# Patient Record
Sex: Male | Born: 1965 | Race: White | Hispanic: No | Marital: Single | Smoking: Former smoker
Health system: Southern US, Community
[De-identification: ages and names within clinical notes are randomized; demographics above are authoritative.]

## PROBLEM LIST (undated history)

## (undated) DIAGNOSIS — I517 Cardiomegaly: Secondary | ICD-10-CM

## (undated) DIAGNOSIS — I513 Intracardiac thrombosis, not elsewhere classified: Secondary | ICD-10-CM

## (undated) DIAGNOSIS — I2510 Atherosclerotic heart disease of native coronary artery without angina pectoris: Secondary | ICD-10-CM

## (undated) DIAGNOSIS — I471 Supraventricular tachycardia: Secondary | ICD-10-CM

## (undated) DIAGNOSIS — I509 Heart failure, unspecified: Secondary | ICD-10-CM

## (undated) HISTORY — DX: Heart failure, unspecified: I50.9

## (undated) HISTORY — DX: Atherosclerotic heart disease of native coronary artery without angina pectoris: I25.10

## (undated) HISTORY — PX: OTHER SURGICAL HISTORY: SHX169

## (undated) HISTORY — DX: Intracardiac thrombosis, not elsewhere classified: I51.3

## (undated) HISTORY — PX: TONSILLECTOMY: SUR1361

---

## 2015-08-14 DIAGNOSIS — I513 Intracardiac thrombosis, not elsewhere classified: Secondary | ICD-10-CM

## 2015-08-14 HISTORY — DX: Intracardiac thrombosis, not elsewhere classified: I51.3

## 2016-03-07 ENCOUNTER — Inpatient Hospital Stay
Admission: EM | Admit: 2016-03-07 | Discharge: 2016-03-12 | DRG: 314 | Disposition: A | Discharge status: Home Health | Payer: BLUE CROSS/BLUE SHIELD | Attending: Specialist | Admitting: Specialist

## 2016-03-07 ENCOUNTER — Encounter: Payer: Self-pay | Admitting: Emergency Medicine

## 2016-03-07 ENCOUNTER — Inpatient Hospital Stay
Admit: 2016-03-07 | Discharge: 2016-03-07 | Disposition: A | Discharge status: Home / Self Care | Payer: BLUE CROSS/BLUE SHIELD | Attending: Specialist | Admitting: Specialist

## 2016-03-07 ENCOUNTER — Emergency Department: Payer: BLUE CROSS/BLUE SHIELD

## 2016-03-07 DIAGNOSIS — S2231XA Fracture of one rib, right side, initial encounter for closed fracture: Secondary | ICD-10-CM | POA: Diagnosis present

## 2016-03-07 DIAGNOSIS — Z833 Family history of diabetes mellitus: Secondary | ICD-10-CM | POA: Diagnosis not present

## 2016-03-07 DIAGNOSIS — I5023 Acute on chronic systolic (congestive) heart failure: Secondary | ICD-10-CM | POA: Diagnosis present

## 2016-03-07 DIAGNOSIS — E039 Hypothyroidism, unspecified: Secondary | ICD-10-CM | POA: Diagnosis present

## 2016-03-07 DIAGNOSIS — F1010 Alcohol abuse, uncomplicated: Secondary | ICD-10-CM | POA: Diagnosis present

## 2016-03-07 DIAGNOSIS — Z87891 Personal history of nicotine dependence: Secondary | ICD-10-CM | POA: Diagnosis not present

## 2016-03-07 DIAGNOSIS — I426 Alcoholic cardiomyopathy: Secondary | ICD-10-CM | POA: Diagnosis not present

## 2016-03-07 DIAGNOSIS — X509XXA Other and unspecified overexertion or strenuous movements or postures, initial encounter: Secondary | ICD-10-CM

## 2016-03-07 DIAGNOSIS — Y929 Unspecified place or not applicable: Secondary | ICD-10-CM

## 2016-03-07 DIAGNOSIS — E876 Hypokalemia: Secondary | ICD-10-CM | POA: Diagnosis present

## 2016-03-07 DIAGNOSIS — I420 Dilated cardiomyopathy: Secondary | ICD-10-CM | POA: Diagnosis present

## 2016-03-07 DIAGNOSIS — I110 Hypertensive heart disease with heart failure: Secondary | ICD-10-CM | POA: Diagnosis present

## 2016-03-07 DIAGNOSIS — R109 Unspecified abdominal pain: Secondary | ICD-10-CM

## 2016-03-07 DIAGNOSIS — I513 Intracardiac thrombosis, not elsewhere classified: Secondary | ICD-10-CM | POA: Diagnosis present

## 2016-03-07 DIAGNOSIS — I214 Non-ST elevation (NSTEMI) myocardial infarction: Secondary | ICD-10-CM | POA: Diagnosis present

## 2016-03-07 DIAGNOSIS — R0781 Pleurodynia: Secondary | ICD-10-CM

## 2016-03-07 HISTORY — DX: Supraventricular tachycardia: I47.1

## 2016-03-07 HISTORY — DX: Cardiomegaly: I51.7

## 2016-03-07 LAB — CBC
HCT: 50.7 % (ref 40.0–52.0)
HEMOGLOBIN: 16.6 g/dL (ref 13.0–18.0)
MCH: 28.7 pg (ref 26.0–34.0)
MCHC: 32.7 g/dL (ref 32.0–36.0)
MCV: 87.8 fL (ref 80.0–100.0)
PLATELETS: 196 10*3/uL (ref 150–440)
RBC: 5.77 MIL/uL (ref 4.40–5.90)
RDW: 14.3 % (ref 11.5–14.5)
WBC: 12.4 10*3/uL — AB (ref 3.8–10.6)

## 2016-03-07 LAB — APTT: APTT: 28 s (ref 24–36)

## 2016-03-07 LAB — COMPREHENSIVE METABOLIC PANEL
ALBUMIN: 3.6 g/dL (ref 3.5–5.0)
ALK PHOS: 103 U/L (ref 38–126)
ALT: 30 U/L (ref 17–63)
AST: 49 U/L — AB (ref 15–41)
Anion gap: 9 (ref 5–15)
BUN: 26 mg/dL — AB (ref 6–20)
CHLORIDE: 105 mmol/L (ref 101–111)
CO2: 25 mmol/L (ref 22–32)
CREATININE: 1.22 mg/dL (ref 0.61–1.24)
Calcium: 8.8 mg/dL — ABNORMAL LOW (ref 8.9–10.3)
GFR calc non Af Amer: >60 mL/min (ref >60)
GLUCOSE: 106 mg/dL — AB (ref 65–99)
Potassium: 4.1 mmol/L (ref 3.5–5.1)
SODIUM: 139 mmol/L (ref 135–145)
Total Bilirubin: 1.5 mg/dL — ABNORMAL HIGH (ref 0.3–1.2)
Total Protein: 6.4 g/dL — ABNORMAL LOW (ref 6.5–8.1)

## 2016-03-07 LAB — PROTIME-INR
INR: 1.16
Prothrombin Time: 14.9 seconds (ref 11.4–15.2)

## 2016-03-07 LAB — TROPONIN I
TROPONIN I: 3.78 ng/mL — AB (ref <0.03)
Troponin I: 3.9 ng/mL (ref <0.03)

## 2016-03-07 MED ORDER — BENZONATATE 100 MG PO CAPS
100.0000 mg | ORAL_CAPSULE | Freq: Three times a day (TID) | ORAL | Status: DC | PRN
  Administered 2016-03-07 – 2016-03-12 (×2): 100 mg via ORAL
  Filled 2016-03-07 (×2): qty 1

## 2016-03-07 MED ORDER — HEPARIN (PORCINE) IN NACL 100-0.45 UNIT/ML-% IJ SOLN
1150.0000 [IU]/h | INTRAMUSCULAR | Status: DC
  Administered 2016-03-07: 850 [IU]/h via INTRAVENOUS
  Administered 2016-03-08 – 2016-03-10 (×3): 1250 [IU]/h via INTRAVENOUS
  Administered 2016-03-11 (×2): 1150 [IU]/h via INTRAVENOUS
  Filled 2016-03-07 (×12): qty 250

## 2016-03-07 MED ORDER — FUROSEMIDE 10 MG/ML IJ SOLN
20.0000 mg | Freq: Two times a day (BID) | INTRAMUSCULAR | Status: DC
  Administered 2016-03-07: 20 mg via INTRAVENOUS
  Filled 2016-03-07 (×2): qty 2

## 2016-03-07 MED ORDER — METOPROLOL SUCCINATE ER 25 MG PO TB24
25.0000 mg | ORAL_TABLET | Freq: Every day | ORAL | Status: DC
  Administered 2016-03-08 – 2016-03-10 (×3): 25 mg via ORAL
  Filled 2016-03-07 (×3): qty 1

## 2016-03-07 MED ORDER — OXYCODONE-ACETAMINOPHEN 5-325 MG PO TABS
2.0000 | ORAL_TABLET | Freq: Once | ORAL | Status: AC
  Administered 2016-03-07: 2 via ORAL
  Filled 2016-03-07: qty 2

## 2016-03-07 MED ORDER — LISINOPRIL 5 MG PO TABS
5.0000 mg | ORAL_TABLET | Freq: Every day | ORAL | Status: DC
  Administered 2016-03-07 – 2016-03-12 (×6): 5 mg via ORAL
  Filled 2016-03-07 (×7): qty 1

## 2016-03-07 MED ORDER — MORPHINE SULFATE (PF) 2 MG/ML IV SOLN
1.0000 mg | INTRAVENOUS | Status: DC | PRN
  Administered 2016-03-08: 1 mg via INTRAVENOUS
  Filled 2016-03-07: qty 1

## 2016-03-07 MED ORDER — ACETAMINOPHEN 650 MG RE SUPP: 650.0000 mg | Freq: Four times a day (QID) | RECTAL | Status: DC | PRN

## 2016-03-07 MED ORDER — ASPIRIN EC 81 MG PO TBEC
81.0000 mg | DELAYED_RELEASE_TABLET | Freq: Every day | ORAL | Status: DC
  Administered 2016-03-07 – 2016-03-12 (×6): 81 mg via ORAL
  Filled 2016-03-07 (×6): qty 1

## 2016-03-07 MED ORDER — ONDANSETRON HCL 4 MG PO TABS: 4.0000 mg | ORAL_TABLET | Freq: Four times a day (QID) | ORAL | Status: DC | PRN

## 2016-03-07 MED ORDER — ASPIRIN 81 MG PO CHEW
324.0000 mg | CHEWABLE_TABLET | Freq: Once | ORAL | Status: AC
  Administered 2016-03-07: 324 mg via ORAL
  Filled 2016-03-07: qty 4

## 2016-03-07 MED ORDER — ONDANSETRON HCL 4 MG/2ML IJ SOLN: 4.0000 mg | Freq: Four times a day (QID) | INTRAMUSCULAR | Status: DC | PRN

## 2016-03-07 MED ORDER — HYDRALAZINE HCL 20 MG/ML IJ SOLN: 10.0000 mg | Freq: Four times a day (QID) | INTRAMUSCULAR | Status: DC | PRN

## 2016-03-07 MED ORDER — HEPARIN BOLUS VIA INFUSION
4000.0000 [IU] | Freq: Once | INTRAVENOUS | Status: AC
  Administered 2016-03-07: 4000 [IU] via INTRAVENOUS
  Filled 2016-03-07: qty 4000

## 2016-03-07 MED ORDER — GUAIFENESIN 100 MG/5ML PO SOLN
5.0000 mL | ORAL | Status: DC | PRN
  Administered 2016-03-07 – 2016-03-12 (×3): 100 mg via ORAL
  Filled 2016-03-07 (×4): qty 10

## 2016-03-07 MED ORDER — OXYCODONE HCL 5 MG PO TABS
5.0000 mg | ORAL_TABLET | ORAL | Status: DC | PRN
  Administered 2016-03-08 – 2016-03-12 (×4): 5 mg via ORAL
  Filled 2016-03-07 (×4): qty 1

## 2016-03-07 MED ORDER — ACETAMINOPHEN 325 MG PO TABS: 650.0000 mg | ORAL_TABLET | Freq: Four times a day (QID) | ORAL | Status: DC | PRN

## 2016-03-07 MED ORDER — ATORVASTATIN CALCIUM 20 MG PO TABS
40.0000 mg | ORAL_TABLET | Freq: Every day | ORAL | Status: DC
  Administered 2016-03-08 – 2016-03-11 (×4): 40 mg via ORAL
  Filled 2016-03-07 (×4): qty 2

## 2016-03-07 NOTE — H&P (Signed)
[facility] -  at [facility]   PATIENT NAME: Theodore Brandt    MR#:  [id number]  DATE OF BIRTH:  1966-01-14  DATE OF ADMISSION:  03/07/2016  PRIMARY CARE PHYSICIAN: No primary care provider on file.   REQUESTING/REFERRING PHYSICIAN: Dr. Minna Antis  CHIEF COMPLAINT:   Chief Complaint  Patient presents with  . Leg Swelling  . Cough    HISTORY OF PRESENT ILLNESS:  Theodore Brandt  is a 50 y.o. male with a known history of LVH, paroxysmal SVT who presents to the hospital due to a chronic cough and lower extremity edema. Patient has had a cough which has been bothersome for him for about 5 weeks. He has been seen by his primary care physician and treated for acute bronchitis without much improvement of symptoms. He then developed some lower extremity edema and his primary care physician referred him to a cardiologist started him on some Lasix and also some beta blocker. He was set up to get echocardiogram coming up in the next few days but he continued to have significant cough and shortness of breath and therefore came to the ER for further evaluation today. Emergency room patient was noted to have a rib fracture on the right side and also noted to have a elevated troponin at 3.9. Hospitalist services were contacted further treatment and evaluation. Patient does complain of chest pain but is more pleuritic in nature related to the right side of his chest. He denies any chest tightness, nausea, vomiting, diaphoresis, palpitations, syncope or any other associated symptoms. Given his elevated troponin and being in mild acute on chronic CHF hospitalist services were contacted further treatment and evaluation.  PAST MEDICAL HISTORY:   Past Medical History:  Diagnosis Date  . Enlarged heart   . Paroxysmal SVT (supraventricular tachycardia) (HCC)     PAST SURGICAL HISTORY:   Past Surgical History:  Procedure Laterality Date  . arm surgery    . TONSILLECTOMY       SOCIAL HISTORY:   Social History  Substance Use Topics  . Smoking status: Former Smoker    Types: Cigars  . Smokeless tobacco: Not on file  . Alcohol use Yes     Comment: Used to drink a 6-12 pack daily.      FAMILY HISTORY:   Family History  Problem Relation Age of Onset  . Diabetes Mother   . Hemochromatosis Father     DRUG ALLERGIES:  No Known Allergies  REVIEW OF SYSTEMS:   Review of Systems  Constitutional: Negative for fever and weight loss.  HENT: Negative for congestion, nosebleeds and tinnitus.   Eyes: Negative for blurred vision, double vision and redness.  Respiratory: Positive for cough and shortness of breath. Negative for hemoptysis.   Cardiovascular: Positive for chest pain, leg swelling and PND. Negative for orthopnea.  Gastrointestinal: Negative for abdominal pain, diarrhea, melena, nausea and vomiting.  Genitourinary: Negative for dysuria, hematuria and urgency.  Musculoskeletal: Negative for falls and joint pain.  Neurological: Negative for dizziness, tingling, sensory change, focal weakness, seizures, weakness and headaches.  Endo/Heme/Allergies: Negative for polydipsia. Does not bruise/bleed easily.  Psychiatric/Behavioral: Negative for depression and memory loss. The patient is not nervous/anxious.     MEDICATIONS AT HOME:   Prior to Admission medications   Medication Sig Start Date End Date Taking? Authorizing Provider  furosemide (LASIX) 20 MG tablet Take 1 tablet by mouth daily. 03/02/16  Yes Historical Provider, MD  metoprolol succinate (TOPROL-XL) 25 MG 24 hr tablet  Take 1 tablet by mouth daily. 02/28/16  Yes Historical Provider, MD  minocycline (MINOCIN,DYNACIN) 100 MG capsule Take 100 mg by mouth 2 (two) times daily.  03/02/16  Yes Historical Provider, MD      VITAL SIGNS:  Blood pressure (!) 140/111, pulse 99, temperature 98.4 F (36.9 C), temperature source Oral, resp. rate 19, height 5' 5.5" (1.664 m), weight 69.5 kg (153 lb 5 oz),  SpO2 97 %.  PHYSICAL EXAMINATION:  Physical Exam  GENERAL:  50 y.o.-year-old patient lying in the bed in no acute distress.  EYES: Pupils equal, round, reactive to light and accommodation. No scleral icterus. Extraocular muscles intact.  HEENT: Head atraumatic, normocephalic. Oropharynx and nasopharynx clear. No oropharyngeal erythema, moist oral mucosa  NECK:  Supple, no jugular venous distention. No thyroid enlargement, no tenderness.  LUNGS: Normal breath sounds bilaterally, no wheezing, rales, rhonchi. No use of accessory muscles of respiration.  CARDIOVASCULAR: S1, S2 RRR. No murmurs, rubs, gallops, clicks.  ABDOMEN: Soft, nontender, nondistended. Bowel sounds present. No organomegaly or mass.  EXTREMITIES: + 2 pitting edema b/l,  No cyanosis, clubbing. + 2 pedal & radial pulses b/l.   NEUROLOGIC: Cranial nerves II through XII are intact. No focal Motor or sensory deficits appreciated b/l PSYCHIATRIC: The patient is alert and oriented x 3. Good affect.  SKIN: No obvious rash, lesion, or ulcer.   LABORATORY PANEL:   CBC  Recent Labs Lab 03/07/16 1641  WBC 12.4*  HGB 16.6  HCT 50.7  PLT 196   ------------------------------------------------------------------------------------------------------------------  Chemistries   Recent Labs Lab 03/07/16 1641  NA 139  K 4.1  CL 105  CO2 25  GLUCOSE 106*  BUN 26*  CREATININE 1.22  CALCIUM 8.8*  AST 49*  ALT 30  ALKPHOS 103  BILITOT 1.5*   ------------------------------------------------------------------------------------------------------------------  Cardiac Enzymes  Recent Labs Lab 03/07/16 1641  TROPONINI 3.90*   ------------------------------------------------------------------------------------------------------------------  RADIOLOGY:  Dg Chest 2 View  Result Date: 03/07/2016 CLINICAL DATA:  Worsening productive cough, shortness of breath and right rib pain over the past 6 weeks. EXAM: CHEST  2 VIEW  COMPARISON:  None. FINDINGS: There is marked cardiomegaly without edema. Very small right pleural effusion is seen. Lungs appear clear. The patient has an acute fracture of the right eighth rib. IMPRESSION: Cardiomegaly without edema. Small right pleural effusion. Acute right eighth rib fracture.  No pneumothorax. Electronically Signed   By: Drusilla Kanner M.D.   On: 03/07/2016 15:27    IMPRESSION AND PLAN:   50 year old male with past medical history of alcohol abuse, paroxysmal SVT, LVH who presents to the hospital due to shortness of breath and cough and noted to be in mild CHF and also noted to have an elevated troponin.  1. Non-ST elevation MI-patient does rule in with cardiac markers given his elevated troponin at 3.9. -He is currently chest pain-free, hemodynamically stable. -Place him on telemetry, cycle his cardiac markers. -Start him on aspirin, heparin nomogram, beta blocker, Ace, statin. I will check a two-dimensional echocardiogram. -Get a cardiology consult.  2. CHF-acute on chronic. Unclear if this is systolic versus diastolic CHF presently. -We will check a two-dimensional echo. Diuresis with IV Lasix, follow I's and O's and daily weights. -Continue metoprolol, add low-dose ACE inhibitor. -Await cardiology input.  3. Fractured right eighth rib-secondary to increasing cough -We'll place him on antitussives. When necessary morphine, oxycodone for pain control.  4. Essential hypertension-continue metoprolol, add low-dose ACE inhibitor. -If needed add when necessary hydralazine.    All the  records are reviewed and case discussed with ED provider. Management plans discussed with the patient, family and they are in agreement.  CODE STATUS: Full   TOTAL TIME TAKING CARE OF THIS PATIENT: 45 minutes.    Houston Siren M.D on 03/07/2016 at 6:36 PM  Between 7am to 6pm - Pager - [phone]  After 6pm go to [email or web address] - password [email or web address] [facility] [address]  [place]  [facility]  Office  [phone]  CC: Primary care physician; No primary care provider on file.

## 2016-03-07 NOTE — ED Triage Notes (Signed)
Pt presents to ED from [facility] for reports of lower leg edema bilaterally and cough. Pt states has been coughing for several weeks and feels like he has bruised a rib from coughing.

## 2016-03-07 NOTE — Progress Notes (Signed)
Nurse was informed by Sedonia Small from echocardiogram that patient has two thrombus in the left ventricle. Dr.Willis notified. Nurse informed Dr. Anne Hahn that patient was currently on a heparin drip at 8.5. Cardiologist consult ordered. Will continue to monitor.

## 2016-03-07 NOTE — ED Notes (Signed)
Attempted to call report, nurse not avalaible given name and number to call back.

## 2016-03-07 NOTE — Progress Notes (Signed)
ANTICOAGULATION CONSULT NOTE - Initial Consult  Pharmacy Consult for heparin drip Indication: chest pain/ACS  No Known Allergies  Patient Measurements: Height: 5' 5.5" (166.4 cm) Weight: 153 lb 5 oz (69.5 kg) IBW/kg (Calculated) : 62.65 Heparin Dosing Weight: 70 kg  Vital Signs: Temp: 98.4 F (36.9 C) (07/26 1424) Temp Source: Oral (07/26 1424) BP: 126/98 (07/26 1630) Pulse Rate: 101 (07/26 1630)  Labs:  Recent Labs  03/07/16 1641  HGB 16.6  HCT 50.7  PLT 196  CREATININE 1.22  TROPONINI 3.90*   Estimated Creatinine Clearance: 64.2 mL/min (by C-G formula based on SCr of 1.22 mg/dL).  Medical History: Past Medical History:  Diagnosis Date  . Enlarged heart   . Paroxysmal SVT (supraventricular tachycardia) (HCC)     Assessment: Pharmacy consulted to dose and monitor heparin drip in this 50 year old male admitted with ACS/NSTEMI. Patient was not taking anticoagulants prior to admission and baseline labs were obtained.   Goal of Therapy:  Heparin level 0.3-0.7 units/ml Monitor platelets by anticoagulation protocol: Yes   Plan:  Give 4000 units bolus x 1 Start heparin infusion at 850 units/hr Check anti-Xa level in 6 hours and daily while on heparin Continue to monitor H&H and platelets  Cindi Carbon, PharmD, BCPS Clinical Pharmacist 03/07/2016,6:07 PM

## 2016-03-07 NOTE — ED Provider Notes (Addendum)
[facility] Emergency Department Provider Note  Time seen: 5:12 PM  I have reviewed the triage vital signs and the nursing notes.   HISTORY  Chief Complaint Leg Swelling and Cough    HPI Theodore Brandt is a 50 y.o. male with a past medical history of SVT, who presents the emergency department for 6 weeks of cough, and several weeks of lower extremity swelling. According to the patient he has had a cough for approximately 6 weeks. He was seen by his primary care doctor recently for the same, was referred to cardiology. Patient was given a prescription for Lasix one week ago by Dr. Gwen Pounds. Patient states continued cough, now with right sided chest pain, much worse when he coughs, takes a deep breath or moves. Describes the chest pain is moderate, sharp when he moves or coughs.  Past Medical History:  Diagnosis Date  . Enlarged heart   . Paroxysmal SVT (supraventricular tachycardia) (HCC)     There are no active problems to display for this patient.   Past Surgical History:  Procedure Laterality Date  . arm surgery    . TONSILLECTOMY      Prior to Admission medications   Not on File    Allergies Review of patient's allergies indicates no known allergies.  No family history on file.  Social History Social History  Substance Use Topics  . Smoking status: Not on file  . Smokeless tobacco: Not on file  . Alcohol use Not on file    Review of Systems Constitutional: Negative for fever. Cardiovascular: Right-sided chest pain. Respiratory: Negative for shortness of breath. Positive for dry cough. Gastrointestinal: Negative for abdominal pain, vomiting and diarrhea. Musculoskeletal: Negative for back pain. Neurological: Negative for headache 10-point ROS otherwise negative.  ____________________________________________   PHYSICAL EXAM:  VITAL SIGNS: ED Triage Vitals [03/07/16 1424]  Enc Vitals Group     BP 125/90     Pulse Rate 98    Resp 18     Temp 98.4 F (36.9 C)     Temp Source Oral     SpO2 98 %     Weight 153 lb 5 oz (69.5 kg)     Height 5' 5.5" (1.664 m)     Head Circumference      Peak Flow      Pain Score      Pain Loc      Pain Edu?      Excl. in GC?     Constitutional: Alert and oriented. Well appearing and in no distress. Eyes: Normal exam ENT   Head: Normocephalic and atraumatic   Mouth/Throat: Mucous membranes are moist. Cardiovascular: Normal rate, regular rhythm. No murmur Respiratory: Normal respiratory effort without tachypnea nor retractions. Breath sounds are clear and equal bilaterally. No wheezes/rales/rhonchi. Gastrointestinal: Soft and nontender. No distention.   Musculoskeletal: Nontender with normal range of motion in all extremities.  Neurologic:  Normal speech and language. No gross focal neurologic deficits  Psychiatric: Mood and affect are normal.   ____________________________________________    EKG  EKG reviewed and interpreted by myself shows sinus tachycardiaAt 99 bpm with narrow QRS, normal axis, normal intervals, diffuse T-wave inversions in the inferolateral leads.    ____________________________________________    RADIOLOGY  Chest x-ray shows acute right eighth fracture  ____________________________________________   INITIAL IMPRESSION / ASSESSMENT AND PLAN / ED COURSE  Pertinent labs & imaging results that were available during my care of the patient were reviewed by me and considered  in my medical decision making (see chart for details).  Patient presents the emergency department 6 weeks of cough, now with right-sided chest pain, sent from his primary care doctor. Patient states lower extremity edema but states this is improving after Dr. Gwen Pounds started him on Lasix one week ago. Patient denies any sputum production, or fever. Patient was seen by his primary care doctor for the cough and was started on antibiotics, he just started taking them  yesterday. Patient's chest x-ray is consistent with a right eighth rib fracture. We will check labs including troponin, EKG, cause monitor in the emergency department. We'll dose pain medication and provided an incentive spirometer.  Patient's labs have resulted showing a troponin at 3.9, EKG shows diffuse T-wave inversions in the inferolateral leads. We will start on heparin drip, patient will be admitted to the hospital for an NSTEMI.   CRITICAL CARE Performed by: Minna Antis   Total critical care time: 30 minutes  Critical care time was exclusive of separately billable procedures and treating other patients.  Critical care was necessary to treat or prevent imminent or life-threatening deterioration.  Critical care was time spent personally by me on the following activities: development of treatment plan with patient and/or surrogate as well as nursing, discussions with consultants, evaluation of patient's response to treatment, examination of patient, obtaining history from patient or surrogate, ordering and performing treatments and interventions, ordering and review of laboratory studies, ordering and review of radiographic studies, pulse oximetry and re-evaluation of patient's condition.  ____________________________________________   FINAL CLINICAL IMPRESSION(S) / ED DIAGNOSES  Right rib fracture Bronchitis NSTEMI   Minna Antis, MD 03/07/16 1751    Minna Antis, MD 03/07/16 1610

## 2016-03-08 ENCOUNTER — Inpatient Hospital Stay: Payer: BLUE CROSS/BLUE SHIELD

## 2016-03-08 LAB — BASIC METABOLIC PANEL
Anion gap: 9 (ref 5–15)
BUN: 26 mg/dL — AB (ref 6–20)
CHLORIDE: 106 mmol/L (ref 101–111)
CO2: 28 mmol/L (ref 22–32)
CREATININE: 1.23 mg/dL (ref 0.61–1.24)
Calcium: 8.4 mg/dL — ABNORMAL LOW (ref 8.9–10.3)
GFR calc Af Amer: >60 mL/min (ref >60)
GFR calc non Af Amer: >60 mL/min (ref >60)
Glucose, Bld: 90 mg/dL (ref 65–99)
Potassium: 3.9 mmol/L (ref 3.5–5.1)
SODIUM: 143 mmol/L (ref 135–145)

## 2016-03-08 LAB — LIPID PANEL
CHOLESTEROL: 104 mg/dL (ref 0–200)
HDL: 32 mg/dL — ABNORMAL LOW (ref >40)
LDL Cholesterol: 60 mg/dL (ref 0–99)
Total CHOL/HDL Ratio: 3.3 RATIO
Triglycerides: 60 mg/dL (ref <150)
VLDL: 12 mg/dL (ref 0–40)

## 2016-03-08 LAB — ECHOCARDIOGRAM COMPLETE
Height: 65.5 in
WEIGHTICAEL: 2453 [oz_av]

## 2016-03-08 LAB — TROPONIN I
Troponin I: 2.8 ng/mL (ref <0.03)
Troponin I: 3.43 ng/mL (ref <0.03)

## 2016-03-08 LAB — CBC
HCT: 48.3 % (ref 40.0–52.0)
Hemoglobin: 15.8 g/dL (ref 13.0–18.0)
MCH: 28.8 pg (ref 26.0–34.0)
MCHC: 32.8 g/dL (ref 32.0–36.0)
MCV: 87.6 fL (ref 80.0–100.0)
PLATELETS: 164 10*3/uL (ref 150–440)
RBC: 5.51 MIL/uL (ref 4.40–5.90)
RDW: 14.5 % (ref 11.5–14.5)
WBC: 11.8 10*3/uL — ABNORMAL HIGH (ref 3.8–10.6)

## 2016-03-08 LAB — HEPARIN LEVEL (UNFRACTIONATED)
Heparin Unfractionated: 0.21 IU/mL — ABNORMAL LOW (ref 0.30–0.70)
Heparin Unfractionated: 0.26 IU/mL — ABNORMAL LOW (ref 0.30–0.70)
Heparin Unfractionated: 0.65 IU/mL (ref 0.30–0.70)

## 2016-03-08 LAB — TSH: TSH: 7.788 u[IU]/mL — AB (ref 0.350–4.500)

## 2016-03-08 MED ORDER — FUROSEMIDE 10 MG/ML IJ SOLN
40.0000 mg | Freq: Two times a day (BID) | INTRAMUSCULAR | Status: DC
  Administered 2016-03-08: 40 mg via INTRAVENOUS
  Filled 2016-03-08: qty 4

## 2016-03-08 MED ORDER — HEPARIN BOLUS VIA INFUSION
2100.0000 [IU] | Freq: Once | INTRAVENOUS | Status: AC
  Administered 2016-03-08: 2100 [IU] via INTRAVENOUS
  Filled 2016-03-08: qty 2100

## 2016-03-08 MED ORDER — HEPARIN BOLUS VIA INFUSION
1000.0000 [IU] | Freq: Once | INTRAVENOUS | Status: AC
  Administered 2016-03-08: 1000 [IU] via INTRAVENOUS
  Filled 2016-03-08: qty 1000

## 2016-03-08 MED ORDER — IOPAMIDOL (ISOVUE-370) INJECTION 76%
100.0000 mL | Freq: Once | INTRAVENOUS | Status: AC | PRN
  Administered 2016-03-08: 100 mL via INTRAVENOUS

## 2016-03-08 MED ORDER — FOLIC ACID 1 MG PO TABS
1.0000 mg | ORAL_TABLET | Freq: Every day | ORAL | Status: DC
  Administered 2016-03-08 – 2016-03-12 (×5): 1 mg via ORAL
  Filled 2016-03-08 (×5): qty 1

## 2016-03-08 MED ORDER — LORAZEPAM 1 MG PO TABS
1.0000 mg | ORAL_TABLET | Freq: Four times a day (QID) | ORAL | Status: AC | PRN
  Filled 2016-03-08: qty 1

## 2016-03-08 MED ORDER — THIAMINE HCL 100 MG/ML IJ SOLN
100.0000 mg | Freq: Every day | INTRAMUSCULAR | Status: DC
  Filled 2016-03-08: qty 2

## 2016-03-08 MED ORDER — LORAZEPAM 2 MG/ML IJ SOLN: 1.0000 mg | Freq: Four times a day (QID) | INTRAMUSCULAR | Status: AC | PRN

## 2016-03-08 MED ORDER — VITAMIN B-1 100 MG PO TABS
100.0000 mg | ORAL_TABLET | Freq: Every day | ORAL | Status: DC
  Administered 2016-03-08 – 2016-03-12 (×5): 100 mg via ORAL
  Filled 2016-03-08 (×5): qty 1

## 2016-03-08 MED ORDER — LORAZEPAM 2 MG PO TABS
0.0000 mg | ORAL_TABLET | Freq: Two times a day (BID) | ORAL | Status: AC
  Administered 2016-03-11: 4 mg via ORAL
  Filled 2016-03-08: qty 2

## 2016-03-08 MED ORDER — ADULT MULTIVITAMIN W/MINERALS CH
1.0000 | ORAL_TABLET | Freq: Every day | ORAL | Status: DC
  Administered 2016-03-08 – 2016-03-12 (×5): 1 via ORAL
  Filled 2016-03-08 (×5): qty 1

## 2016-03-08 MED ORDER — LORAZEPAM 2 MG PO TABS
0.0000 mg | ORAL_TABLET | Freq: Four times a day (QID) | ORAL | Status: AC
  Administered 2016-03-09: 1 mg via ORAL

## 2016-03-08 NOTE — Progress Notes (Addendum)
ANTICOAGULATION CONSULT NOTE - Initial Consult  Pharmacy Consult for heparin drip Indication: LV thrombus  No Known Allergies  Patient Measurements: Height: 5' 5.5" (166.4 cm) Weight: 153 lb 5 oz (69.5 kg) IBW/kg (Calculated) : 62.65 Heparin Dosing Weight: 70 kg  Vital Signs: Temp: 98 F (36.7 C) (07/27 1145) BP: 141/83 (07/27 1145) Pulse Rate: 93 (07/27 1145)  Labs:  Recent Labs  03/07/16 1515  03/07/16 1641 03/07/16 2109 03/08/16 0135 03/08/16 0542 03/08/16 0847 03/08/16 1617  HGB  --   --  16.6  --   --  15.8  --   --   HCT  --   --  50.7  --   --  48.3  --   --   PLT  --   --  196  --   --  164  --   --   APTT 28  --   --   --   --   --   --   --   LABPROT 14.9  --   --   --   --   --   --   --   INR 1.16  --   --   --   --   --   --   --   HEPARINUNFRC  --   --   --   --  0.21*  --  0.26* 0.65  CREATININE  --   --  1.22  --   --  1.23  --   --   TROPONINI  --   < > 3.90* 3.78* 2.80* 3.43*  --   --   < > = values in this interval not displayed. Estimated Creatinine Clearance: 63.7 mL/min (by C-G formula based on SCr of 1.23 mg/dL).  Medical History: Past Medical History:  Diagnosis Date  . Enlarged heart   . Paroxysmal SVT (supraventricular tachycardia) (HCC)     Assessment: Pharmacy consulted to dose and monitor heparin drip in this 50 year old male admitted with ACS/NSTEMI, now with LV thromus. Patient was not taking anticoagulants prior to admission and baseline labs were obtained.   Goal of Therapy:  Heparin level 0.3-0.7 units/ml Monitor platelets by anticoagulation protocol: Yes   Plan:  7/27 HL 0.65 is therapeutic. Will continue heparin drip at current rate of 1250 units/hr and order confirmatory HL in 6 hours.   7/27 Confirmatory heparin level 0.51. Continue current regimen and recheck with CBC tomorrow AM  7/28 AM heparin level 0.61. Continue current regimen. Recheck CBC and heparin level tomorrow AM.  Cindi Carbon, PharmD, BCPS Clinical  Pharmacist 03/08/2016,5:18 PM

## 2016-03-08 NOTE — Progress Notes (Signed)
[employer] - [place] at [facility]   PATIENT NAME: Theodore Brandt    MR#:  [id number]  DATE OF BIRTH:  November 01, 1965  SUBJECTIVE:   Patient presented with cough for the past few weeks and lower extremity edema. He was found to have rib fracture as well as elevation in troponin. He denies chest pain, shortness of breath, fever or chills. He denies weight loss or night sweats.  REVIEW OF SYSTEMS:    Review of Systems  Constitutional: Negative.  Negative for chills, fever and malaise/fatigue.  HENT: Negative.  Negative for ear discharge, ear pain, hearing loss, nosebleeds and sore throat.   Eyes: Negative.  Negative for blurred vision and pain.  Respiratory: Negative.  Negative for cough, hemoptysis, shortness of breath and wheezing.   Cardiovascular: Negative.  Negative for chest pain, palpitations and leg swelling.  Gastrointestinal: Negative.  Negative for abdominal pain, blood in stool, diarrhea, nausea and vomiting.  Genitourinary: Negative.  Negative for dysuria.  Musculoskeletal: Negative.  Negative for back pain.  Skin: Negative.   Neurological: Negative for dizziness, tremors, speech change, focal weakness, seizures and headaches.  Endo/Heme/Allergies: Negative.  Does not bruise/bleed easily.  Psychiatric/Behavioral: Negative.  Negative for depression, hallucinations and suicidal ideas.    Tolerating Diet:yes      DRUG ALLERGIES:  No Known Allergies  VITALS:  Blood pressure (!) 117/95, pulse 91, temperature 98.1 F (36.7 C), resp. rate 20, height 5' 5.5" (1.664 m), weight 69.5 kg (153 lb 5 oz), SpO2 95 %.  PHYSICAL EXAMINATION:   Physical Exam  Constitutional: He is oriented to person, place, and time and well-developed, well-nourished, and in no distress. No distress.  HENT:  Head: Normocephalic.  Eyes: No scleral icterus.  Neck: Normal range of motion. Neck supple. No JVD present. No tracheal deviation present.  Cardiovascular: Normal rate,  regular rhythm and normal heart sounds.  Exam reveals no gallop and no friction rub.   No murmur heard. Pulmonary/Chest: Effort normal and breath sounds normal. No respiratory distress. He has no wheezes. He has no rales. He exhibits no tenderness.  Abdominal: Soft. Bowel sounds are normal. He exhibits no distension and no mass. There is no tenderness. There is no rebound and no guarding.  Musculoskeletal: Normal range of motion. He exhibits no edema.  Neurological: He is alert and oriented to person, place, and time.  Skin: Skin is warm. No rash noted. No erythema.  Psychiatric: Affect and judgment normal.      LABORATORY PANEL:   CBC  Recent Labs Lab 03/08/16 0542  WBC 11.8*  HGB 15.8  HCT 48.3  PLT 164   ------------------------------------------------------------------------------------------------------------------  Chemistries   Recent Labs Lab 03/07/16 1641 03/08/16 0542  NA 139 143  K 4.1 3.9  CL 105 106  CO2 25 28  GLUCOSE 106* 90  BUN 26* 26*  CREATININE 1.22 1.23  CALCIUM 8.8* 8.4*  AST 49*  --   ALT 30  --   ALKPHOS 103  --   BILITOT 1.5*  --    ------------------------------------------------------------------------------------------------------------------  Cardiac Enzymes  Recent Labs Lab 03/07/16 2109 03/08/16 0135 03/08/16 0542  TROPONINI 3.78* 2.80* 3.43*   ------------------------------------------------------------------------------------------------------------------  RADIOLOGY:  Dg Chest 2 View  Result Date: 03/07/2016 CLINICAL DATA:  Worsening productive cough, shortness of breath and right rib pain over the past 6 weeks. EXAM: CHEST  2 VIEW COMPARISON:  None. FINDINGS: There is marked cardiomegaly without edema. Very small right pleural effusion is seen. Lungs appear clear. The patient has  an acute fracture of the right eighth rib. IMPRESSION: Cardiomegaly without edema. Small right pleural effusion. Acute right eighth rib fracture.   No pneumothorax. Electronically Signed   By: Drusilla Kanner M.D.   On: 03/07/2016 15:27    ASSESSMENT AND PLAN:   50 year old male with a history of proximal SVT who presented with chronic cough and found to have elevation in troponin and rib fracture.  1. Cardiomyopathy: Echocardiogram is showing EF of 20-25% with 3 large LV masses consistent with thrombus. Unclear etiology of cardiomyopathy.. EtOH related versus ischemic in nature. Follow up on cardiology recommendations. Continue ACE inhibitor and beta blocker.  2.intracardiac thrombus: I am suspecting patient may need TEE. Further recommendations after cardiology evaluation. Patient may benefit from chest CT as well to evaluate for seeding of thrombus. Continue heparin drip. 3. Elevation troponin: Patient's symptoms not consistent with acute cardiac event.  4. Acute systolic heart failure: Patient is asymptomatic. Continue metoprolol and ACE inhibitor. Check TSH 5. Fractured right eighth rib secondary to coughing: Continue supportive care.  6. EtOH abuse: CIWA protocol initiated. Further evaluation after cardiology evaluation. Continue heparin drip and statin Management plans discussed with the patient and he is in agreement.  CODE STATUS: full  TOTAL TIME TAKING CARE OF THIS PATIENT: 33 minutes.   Dr Evette Georges  POSSIBLE D/C 3-5 days, DEPENDING ON CLINICAL CONDITION.   Theodore Brandt M.D on 03/08/2016 at 10:00 AM  Between 7am to 6pm - Pager - [phone] After 6pm go to [email or web address] - Social research officer, government  [facility] [place] [facility]  Office  [phone]  CC: Primary care physician; No primary care provider on file.  Note: This dictation was prepared with Dragon dictation along with smaller phrase technology. Any transcriptional errors that result from this process are unintentional.

## 2016-03-08 NOTE — Discharge Instructions (Signed)
[facility] appointment on March 23, 2016 at 9:00am with Clarisa Kindred, FNP. Please call [phone] to reschedule.

## 2016-03-08 NOTE — Progress Notes (Signed)
ANTICOAGULATION CONSULT NOTE - Initial Consult  Pharmacy Consult for heparin drip Indication: chest pain/ACS  No Known Allergies  Patient Measurements: Height: 5' 5.5" (166.4 cm) Weight: 153 lb 5 oz (69.5 kg) IBW/kg (Calculated) : 62.65 Heparin Dosing Weight: 70 kg  Vital Signs: Temp: 98.6 F (37 C) (07/26 2034) BP: 132/105 (07/26 2034) Pulse Rate: 100 (07/26 2034)  Labs:  Recent Labs  03/07/16 1515 03/07/16 1641 03/07/16 2109 03/08/16 0135  HGB  --  16.6  --   --   HCT  --  50.7  --   --   PLT  --  196  --   --   APTT 28  --   --   --   LABPROT 14.9  --   --   --   INR 1.16  --   --   --   HEPARINUNFRC  --   --   --  0.21*  CREATININE  --  1.22  --   --   TROPONINI  --  3.90* 3.78*  --    Estimated Creatinine Clearance: 64.2 mL/min (by C-G formula based on SCr of 1.22 mg/dL).  Medical History: Past Medical History:  Diagnosis Date  . Enlarged heart   . Paroxysmal SVT (supraventricular tachycardia) (HCC)     Assessment: Pharmacy consulted to dose and monitor heparin drip in this 50 year old male admitted with ACS/NSTEMI. Patient was not taking anticoagulants prior to admission and baseline labs were obtained.   Goal of Therapy:  Heparin level 0.3-0.7 units/ml Monitor platelets by anticoagulation protocol: Yes   Plan:  Give 4000 units bolus x 1 Start heparin infusion at 850 units/hr Check anti-Xa level in 6 hours and daily while on heparin Continue to monitor H&H and platelets   7/27 01:30 heparin level 0.21. Since we have received a new heparin consult for LV thrombus, will give 2100 unit bolus and increase rate to 1100 units/hr. Recheck in 6 hours.    Junaid Wurzer S, PharmD, BCPS Clinical Pharmacist 03/08/2016,2:45 AM

## 2016-03-08 NOTE — Progress Notes (Signed)
Initial Heart Failure Clinic appointment scheduled for March 23, 2016 @ 9:00am. Thank you.

## 2016-03-08 NOTE — Progress Notes (Signed)
ANTICOAGULATION CONSULT NOTE - Initial Consult  Pharmacy Consult for heparin drip Indication: LV thrombus  No Known Allergies  Patient Measurements: Height: 5' 5.5" (166.4 cm) Weight: 153 lb 5 oz (69.5 kg) IBW/kg (Calculated) : 62.65 Heparin Dosing Weight: 70 kg  Vital Signs: Temp: 98.1 F (36.7 C) (07/27 0539) BP: 117/95 (07/27 0539) Pulse Rate: 91 (07/27 0539)  Labs:  Recent Labs  03/07/16 1515  03/07/16 1641 03/07/16 2109 03/08/16 0135 03/08/16 0542 03/08/16 0847  HGB  --   --  16.6  --   --  15.8  --   HCT  --   --  50.7  --   --  48.3  --   PLT  --   --  196  --   --  164  --   APTT 28  --   --   --   --   --   --   LABPROT 14.9  --   --   --   --   --   --   INR 1.16  --   --   --   --   --   --   HEPARINUNFRC  --   --   --   --  0.21*  --  0.26*  CREATININE  --   --  1.22  --   --  1.23  --   TROPONINI  --   < > 3.90* 3.78* 2.80* 3.43*  --   < > = values in this interval not displayed. Estimated Creatinine Clearance: 63.7 mL/min (by C-G formula based on SCr of 1.23 mg/dL).  Medical History: Past Medical History:  Diagnosis Date  . Enlarged heart   . Paroxysmal SVT (supraventricular tachycardia) (HCC)     Assessment: Pharmacy consulted to dose and monitor heparin drip in this 50 year old male admitted with ACS/NSTEMI, now with LV thromus. Patient was not taking anticoagulants prior to admission and baseline labs were obtained.   Goal of Therapy:  Heparin level 0.3-0.7 units/ml Monitor platelets by anticoagulation protocol: Yes   Plan:  Current orders for heparin 1100 units/hr. HL: 0.26 Will give heparin 1000 unit bolus, increase drip to 1250 units/hr. Recheck HL in 6 hours  Martyn Malay, PharmD Clinical Pharmacist 03/08/2016,9:48 AM

## 2016-03-08 NOTE — Care Management (Signed)
Patient presents from home where he was independent in all adls and no issues accessing medical care.  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   He lives with his parents and has been on a medical decline over the last couple of weeks with onset of bronchitis and  lower extremity swelling.  Known history of cardiomegaly- cause unknown- ?ischemic vs etoh.  Patient is currently on heparin for large apical thrombus with thrombus on the mitral valve.   Very decreased EF of 20%. Rib fracture due to coughing due to his bronchitis.  Referral to be made to heart failure clinic.  Is on CIWA protocol.    Currently satting 97% on room air.  Will need to assess sats on room air with exertion

## 2016-03-08 NOTE — Consult Note (Signed)
[facility] CLINIC CARDIOLOGY [facility]  CARDIOLOGY CONSULT NOTE  Patient ID: Theodore Brandt MRN: [id number] DOB/AGE: 1966/03/16 50 y.o.  Admit date: 03/07/2016 Referring Physician Dr. Juliene Pina Primary Physician   Primary Cardiologist Dr. Gwen Pounds Reason for Consultation abnormal troponin/lv masses vs thrombus  HPI: Pt is a 50 yo male with history of psvt, cardiomegaly, etoh abuse (6-12 pack daily) who was admitted after presenting to the er with complaints of 4-5 weeks of cough, sob and right sided chest pain.  He is a somewhat difficult historian but states he has noted lower extremety edema and sob. He was treated for bronchitis with no improvement. He was set up for an out patient echo which has not been completed and he was treated with metoprolol and lasix. Symptoms worsened and her presented to the er. CXR showed right rib fracture and cardiomegaly but no pulmonary edema. No ptx.  He denies any neurologic sx. He is unaware of fever or chills. He was noted ot have a serum troponin of 3.9. He was started on heparin and echo done which revealed ef of less than 20% with hypokinetic rv. There are three distinct masses in his left ventricl. There is a large 2x3 cm fixed apical mass and two other more mobile friable masses, one of which is near the outflow tract. Mild mr and mod tr. Global hypokinesis  Review of Systems  Constitutional: Positive for malaise/fatigue.  HENT: Negative.   Eyes: Negative.   Respiratory: Positive for cough and shortness of breath.   Cardiovascular: Positive for chest pain and leg swelling.  Gastrointestinal: Negative.   Genitourinary: Negative.   Musculoskeletal: Negative.   Skin: Negative.   Neurological: Positive for weakness.  Endo/Heme/Allergies: Negative.   Psychiatric/Behavioral: Positive for substance abuse.    Past Medical History:  Diagnosis Date  . Enlarged heart   . Paroxysmal SVT (supraventricular tachycardia) (HCC)     Family History   Problem Relation Age of Onset  . Diabetes Mother   . Hemochromatosis Father     Social History   Social History  . Marital status: Single    Spouse name: N/A  . Number of children: N/A  . Years of education: N/A   Occupational History  . Not on file.   Social History Main Topics  . Smoking status: Former Smoker    Types: Cigars  . Smokeless tobacco: Not on file  . Alcohol use Yes     Comment: Used to drink a 6-12 pack daily.    . Drug use: No  . Sexual activity: Not on file   Other Topics Concern  . Not on file   Social History Narrative  . No narrative on file    Past Surgical History:  Procedure Laterality Date  . arm surgery    . TONSILLECTOMY       Prescriptions Prior to Admission  Medication Sig Dispense Refill Last Dose  . furosemide (LASIX) 20 MG tablet Take 1 tablet by mouth daily.  0 03/07/2016 at 0700  . metoprolol succinate (TOPROL-XL) 25 MG 24 hr tablet Take 1 tablet by mouth daily.  10 03/07/2016 at 0700  . minocycline (MINOCIN,DYNACIN) 100 MG capsule Take 100 mg by mouth 2 (two) times daily.   0 03/07/2016 at 0700    Physical Exam: Blood pressure (!) 117/95, pulse 91, temperature 98.1 F (36.7 C), resp. rate 20, height 5' 5.5" (1.664 m), weight 69.5 kg (153 lb 5 oz), SpO2 95 %.   Wt Readings from Last  1 Encounters:  03/07/16 69.5 kg (153 lb 5 oz)     Resp: diminished breath sounds bilaterally Chest wall: right sided chest wall tenderness Cardio: regular rate and rhythm GI: soft, non-tender; bowel sounds normal; no masses,  no organomegaly Extremities: edema 3+ Pulses: 2+ and symmetric Neurologic: Grossly normal  Labs:   Lab Results  Component Value Date   WBC 11.8 (H) 03/08/2016   HGB 15.8 03/08/2016   HCT 48.3 03/08/2016   MCV 87.6 03/08/2016   PLT 164 03/08/2016    Recent Labs Lab 03/07/16 1641 03/08/16 0542  NA 139 143  K 4.1 3.9  CL 105 106  CO2 25 28  BUN 26* 26*  CREATININE 1.22 1.23  CALCIUM 8.8* 8.4*  PROT 6.4*  --    BILITOT 1.5*  --   ALKPHOS 103  --   ALT 30  --   AST 49*  --   GLUCOSE 106* 90   Lab Results  Component Value Date   TROPONINI 3.43 (HH) 03/08/2016      Radiology: cardiomegaly without significant pulmonary edema EKG: nsr  ASSESSMENT AND PLAN:  50 yo with 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 Elevated tropnin is decreasing. May be secondary to chf vs ischemia. Symptoms were not c/w acute coronary event. Pt has hisotry of heavy drinking. Will need chest and head ct to evaluate for evidence of embolic events to brain as well as evidence of pulmonary abnormalities. Will get further opinion from tertiary center regarding the masses. Will defer cardiac cath at present and consider in the furture to guide further therapy. COntinue heparin, beta blocker and after load reduction. Will stop diuresis for now and follow. Will likely need tee to better evaluate masses.  Signed: Dalia Heading MD, [facility] 03/08/2016, 9:41 AM

## 2016-03-08 NOTE — Plan of Care (Signed)
Problem: Consults Goal: Chest Pain Patient Education (See Patient Education module for education specifics.) Outcome: Completed/Met Date Met: 03/08/16 Handouts given to patient r/t Dx Goal: Skin Care Protocol Initiated - if Braden Score 18 or less If consults are not indicated, leave blank or document N/A Outcome: Completed/Met Date Met: 03/08/16 No skin issues noted at this time.  Problem: Phase I Progression Outcomes Goal: Hemodynamically stable Outcome: Progressing Patient and VS are stabled, will continue to monitor. Goal: Anginal pain relieved Outcome: Completed/Met Date Met: 03/08/16 Not chest pain at this time, only right rib cage pain r/t broken rib Goal: Aspirin unless contraindicated Outcome: Completed/Met Date Met: 03/08/16 324 ASA given in ED Goal: MD aware of Cardiac Marker results Outcome: Completed/Met Date Met: 03/08/16 Doctor is aware

## 2016-03-09 DIAGNOSIS — I513 Intracardiac thrombosis, not elsewhere classified: Secondary | ICD-10-CM

## 2016-03-09 DIAGNOSIS — I420 Dilated cardiomyopathy: Secondary | ICD-10-CM

## 2016-03-09 LAB — ANTITHROMBIN III: ANTITHROMB III FUNC: 91 % (ref 75–120)

## 2016-03-09 LAB — CBC
HEMATOCRIT: 45 % (ref 40.0–52.0)
HEMOGLOBIN: 15 g/dL (ref 13.0–18.0)
MCH: 29.3 pg (ref 26.0–34.0)
MCHC: 33.2 g/dL (ref 32.0–36.0)
MCV: 88.1 fL (ref 80.0–100.0)
Platelets: 154 10*3/uL (ref 150–440)
RBC: 5.11 MIL/uL (ref 4.40–5.90)
RDW: 14.5 % (ref 11.5–14.5)
WBC: 8.6 10*3/uL (ref 3.8–10.6)

## 2016-03-09 LAB — BASIC METABOLIC PANEL
ANION GAP: 8 (ref 5–15)
BUN: 24 mg/dL — ABNORMAL HIGH (ref 6–20)
CHLORIDE: 102 mmol/L (ref 101–111)
CO2: 30 mmol/L (ref 22–32)
Calcium: 8.4 mg/dL — ABNORMAL LOW (ref 8.9–10.3)
Creatinine, Ser: 1.13 mg/dL (ref 0.61–1.24)
GFR calc non Af Amer: >60 mL/min (ref >60)
GLUCOSE: 121 mg/dL — AB (ref 65–99)
Potassium: 3.3 mmol/L — ABNORMAL LOW (ref 3.5–5.1)
Sodium: 140 mmol/L (ref 135–145)

## 2016-03-09 LAB — MAGNESIUM: Magnesium: 2.2 mg/dL (ref 1.7–2.4)

## 2016-03-09 LAB — HEPARIN LEVEL (UNFRACTIONATED)
HEPARIN UNFRACTIONATED: 0.51 [IU]/mL (ref 0.30–0.70)
HEPARIN UNFRACTIONATED: 0.61 [IU]/mL (ref 0.30–0.70)

## 2016-03-09 LAB — PROTIME-INR
INR: 1.14
PROTHROMBIN TIME: 14.7 s (ref 11.4–15.2)

## 2016-03-09 LAB — T4, FREE: Free T4: 1.12 ng/dL (ref 0.61–1.12)

## 2016-03-09 MED ORDER — WARFARIN SODIUM 5 MG PO TABS
5.0000 mg | ORAL_TABLET | Freq: Every day | ORAL | Status: DC
  Administered 2016-03-09 – 2016-03-11 (×3): 5 mg via ORAL
  Filled 2016-03-09 (×3): qty 1

## 2016-03-09 MED ORDER — WARFARIN - PHYSICIAN DOSING INPATIENT
Freq: Every day | Status: DC
  Administered 2016-03-09: 18:00:00

## 2016-03-09 MED ORDER — POTASSIUM CHLORIDE CRYS ER 20 MEQ PO TBCR
20.0000 meq | EXTENDED_RELEASE_TABLET | Freq: Two times a day (BID) | ORAL | Status: DC
  Administered 2016-03-09 – 2016-03-10 (×4): 20 meq via ORAL
  Filled 2016-03-09 (×4): qty 1

## 2016-03-09 NOTE — Progress Notes (Signed)
[facility] CARD[facility]  SUBJECTIVE: no new complaints   Vitals:   03/08/16 0539 03/08/16 1145 03/08/16 1958 03/09/16 0538  BP: (!) 117/95 (!) 141/83 114/85 113/81  Pulse: 91 93 96 82  Resp: 20 20    Temp: 98.1 F (36.7 C) 98 F (36.7 C) 97.4 F (36.3 C) 98 F (36.7 C)  TempSrc:   Oral Oral  SpO2: 95% 97% 99% 93%  Weight:      Height:        Intake/Output Summary (Last 24 hours) at 03/09/16 0813 Last data filed at 03/09/16 0744  Gross per 24 hour  Intake           265.78 ml  Output              800 ml  Net          -534.22 ml    LABS: Basic Metabolic Panel:  Recent Labs  04/54/09 0542 03/09/16 0426  NA 143 140  K 3.9 3.3*  CL 106 102  CO2 28 30  GLUCOSE 90 121*  BUN 26* 24*  CREATININE 1.23 1.13  CALCIUM 8.4* 8.4*  MG  --  2.2   Liver Function Tests:  Recent Labs  03/07/16 1641  AST 49*  ALT 30  ALKPHOS 103  BILITOT 1.5*  PROT 6.4*  ALBUMIN 3.6   No results for input(s): LIPASE, AMYLASE in the last 72 hours. CBC:  Recent Labs  03/08/16 0542 03/09/16 0426  WBC 11.8* 8.6  HGB 15.8 15.0  HCT 48.3 45.0  MCV 87.6 88.1  PLT 164 154   Cardiac Enzymes:  Recent Labs  03/07/16 2109 03/08/16 0135 03/08/16 0542  TROPONINI 3.78* 2.80* 3.43*   BNP: Invalid input(s): POCBNP D-Dimer: No results for input(s): DDIMER in the last 72 hours. Hemoglobin A1C: No results for input(s): HGBA1C in the last 72 hours. Fasting Lipid Panel:  Recent Labs  03/08/16 0847  CHOL 104  HDL 32*  LDLCALC 60  TRIG 60  CHOLHDL 3.3   Thyroid Function Tests:  Recent Labs  03/08/16 0847  TSH 7.788*   Anemia Panel: No results for input(s): VITAMINB12, FOLATE, FERRITIN, TIBC, IRON, RETICCTPCT in the last 72 hours.   Physical Exam: Blood pressure 113/81, pulse 82, temperature 98 F (36.7 C), temperature source Oral, resp. rate 20, height 5' 5.5" (1.664 m), weight 69.5 kg (153 lb 5 oz), SpO2 93 %.   Wt Readings from Last 1  Encounters:  03/07/16 69.5 kg (153 lb 5 oz)     General appearance: alert and cooperative Resp: clear to auscultation bilaterally Cardio: regular rate and rhythm GI: soft, non-tender; bowel sounds normal; no masses,  no organomegaly Extremities: extremities normal, atraumatic, no cyanosis or edema Neurologic: Grossly normal  TELEMETRY: Reviewed telemetry pt in nsr:  ASSESSMENT AND PLAN:  Active Problems:   NSTEMI (non-ST elevated myocardial infarction) (HCC)-elevated serum troponin . 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 Is also somewhat hypothyroid. Acute coronary event is less likely given presenting complaints. Not candidate fro invasive evaluation at present given heavy thrombus burden in lv. WIll continue with medical management for now.    Congestive dilated cardiomyopathy (HCC)-continue with metoprolol succ, ace I and low sodium diet. Will need chf clinic after discharge   LV (left ventricular) mural thrombus (HCC)-continue with iv heparin and will start on po warfarin today. Would not discharge until therapeutic on warfarin. Will reevaluate lv mural thrombus after several weeks of anticoagulation.  Dalia Heading., MD, [facility] 03/09/2016 8:13 AM

## 2016-03-09 NOTE — Progress Notes (Signed)
Pharmacy Consult for electrolyte replacement  No Known Allergies  Labs:  Recent Labs  03/07/16 1515 03/07/16 1641 03/08/16 0542 03/09/16 0426  WBC  --  12.4* 11.8* 8.6  HGB  --  16.6 15.8 15.0  HCT  --  50.7 48.3 45.0  PLT  --  196 164 154  APTT 28  --   --   --   INR 1.16  --   --   --      Recent Labs  03/07/16 1641 03/08/16 0542 03/08/16 0847 03/09/16 0426  NA 139 143  --  140  K 4.1 3.9  --  3.3*  CL 105 106  --  102  CO2 25 28  --  30  GLUCOSE 106* 90  --  121*  BUN 26* 26*  --  24*  CREATININE 1.22 1.23  --  1.13  CALCIUM 8.8* 8.4*  --  8.4*  MG  --   --   --  2.2  PROT 6.4*  --   --   --   ALBUMIN 3.6  --   --   --   AST 49*  --   --   --   ALT 30  --   --   --   ALKPHOS 103  --   --   --   BILITOT 1.5*  --   --   --   TRIG  --   --  60  --   CHOLHDL  --   --  3.3  --   CHOL  --   --  104  --    Estimated Creatinine Clearance: 68.6 mL/min (by C-G formula based on SCr of 1.13 mg/dL).   No results for input(s): GLUCAP in the last 72 hours.  Assessment: Electrolytes WNL except potassium at 3.3  Plan:  Potassium PO x 1, will recheck with AM labs  Theodore Brandt C 03/09/2016,7:47 AM

## 2016-03-09 NOTE — Care Management (Signed)
Patient being referred to heart failure clinic.  Per attending, patient will require IV heparin infusion until INR is therapeutic  (2.5 - 3).  Coumadin to be started today.  Do not anticipate discharge home over the weekend.  There are no issues with follow up appointments obtaining medications.

## 2016-03-09 NOTE — Progress Notes (Signed)
[employer] [facility] [place] [facility]   PATIENT NAME: Theodore Brandt    MR#:  [id number]  DATE OF BIRTH:  07/31/1966  SUBJECTIVE:   Patient presented with cough for the past few weeks and lower extremity edema. He was found to have rib fracture as well as elevation in troponin. He denies chest pain, shortness of breath, fever or chills. He denies weight loss or night sweats.  REVIEW OF SYSTEMS:    Review of Systems  Constitutional: Negative.  Negative for chills, fever and malaise/fatigue.  HENT: Negative.  Negative for ear discharge, ear pain, hearing loss, nosebleeds and sore throat.   Eyes: Negative.  Negative for blurred vision and pain.  Respiratory: Negative.  Negative for cough, hemoptysis, shortness of breath and wheezing.   Cardiovascular: Negative.  Negative for chest pain, palpitations and leg swelling.  Gastrointestinal: Negative.  Negative for abdominal pain, blood in stool, diarrhea, nausea and vomiting.  Genitourinary: Negative.  Negative for dysuria.  Musculoskeletal: Negative.  Negative for back pain.  Skin: Negative.   Neurological: Negative for dizziness, tremors, speech change, focal weakness, seizures and headaches.  Endo/Heme/Allergies: Negative.  Does not bruise/bleed easily.  Psychiatric/Behavioral: Negative.  Negative for depression, hallucinations and suicidal ideas.    Tolerating Diet:yes      DRUG ALLERGIES:  No Known Allergies  VITALS:  Blood pressure 112/82, pulse 91, temperature 97.8 F (36.6 C), temperature source Oral, resp. rate 18, height 5' 5.5" (1.664 m), weight 69.5 kg (153 lb 5 oz), SpO2 98 %.  PHYSICAL EXAMINATION:   Physical Exam  Constitutional: He is oriented to person, place, and time and well-developed, well-nourished, and in no distress. No distress.  HENT:  Head: Normocephalic.  Eyes: No scleral icterus.  Neck: Normal range of motion. Neck supple. No JVD present. No tracheal deviation present.   Cardiovascular: Normal rate, regular rhythm and normal heart sounds.  Exam reveals no gallop and no friction rub.   No murmur heard. Pulmonary/Chest: Effort normal and breath sounds normal. No respiratory distress. He has no wheezes. He has no rales. He exhibits no tenderness.  Abdominal: Soft. Bowel sounds are normal. He exhibits no distension and no mass. There is no tenderness. There is no rebound and no guarding.  Musculoskeletal: Normal range of motion. He exhibits no edema.  Neurological: He is alert and oriented to person, place, and time.  Skin: Skin is warm. No rash noted. No erythema.  Psychiatric: Affect and judgment normal.      LABORATORY PANEL:   CBC  Recent Labs Lab 03/09/16 0426  WBC 8.6  HGB 15.0  HCT 45.0  PLT 154   ------------------------------------------------------------------------------------------------------------------  Chemistries   Recent Labs Lab 03/07/16 1641  03/09/16 0426  NA 139  < > 140  K 4.1  < > 3.3*  CL 105  < > 102  CO2 25  < > 30  GLUCOSE 106*  < > 121*  BUN 26*  < > 24*  CREATININE 1.22  < > 1.13  CALCIUM 8.8*  < > 8.4*  MG  --   --  2.2  AST 49*  --   --   ALT 30  --   --   ALKPHOS 103  --   --   BILITOT 1.5*  --   --   < > = values in this interval not displayed. ------------------------------------------------------------------------------------------------------------------  Cardiac Enzymes  Recent Labs Lab 03/07/16 2109 03/08/16 0135 03/08/16 0542  TROPONINI 3.78* 2.80* 3.43*   ------------------------------------------------------------------------------------------------------------------  RADIOLOGY:  Dg Chest 2 View  Result Date: 03/07/2016 CLINICAL DATA:  Worsening productive cough, shortness of breath and right rib pain over the past 6 weeks. EXAM: CHEST  2 VIEW COMPARISON:  None. FINDINGS: There is marked cardiomegaly without edema. Very small right pleural effusion is seen. Lungs appear clear. The  patient has an acute fracture of the right eighth rib. IMPRESSION: Cardiomegaly without edema. Small right pleural effusion. Acute right eighth rib fracture.  No pneumothorax. Electronically Signed   By: Drusilla Kanner M.D.   On: 03/07/2016 15:27  Ct Head Wo Contrast  Result Date: 03/08/2016 CLINICAL DATA:  Chest pain and shortness of breath. Recent diagnosis of intracardiac thrombus. Evaluate embolic event to brain. EXAM: CT HEAD WITHOUT CONTRAST TECHNIQUE: Contiguous axial images were obtained from the base of the skull through the vertex without intravenous contrast. COMPARISON:  None. FINDINGS: Brain: There is mild generalized age- related parenchymal atrophy with commensurate dilatation of the ventricles and sulci. All areas of the brain demonstrate normal gray-white matter attenuation. There is no mass, hemorrhage, edema or other evidence of acute parenchymal abnormality. No extra-axial hemorrhage. Vascular: No hyperdense vessel or unexpected calcification. Skull: Negative for fracture or focal lesion. Sinuses/Orbits: No acute findings. Other: None. IMPRESSION: Negative head CT.  No intracranial mass, hemorrhage or edema. Electronically Signed   By: Bary Richard M.D.   On: 03/08/2016 19:33  Ct Angio Chest Pe W Or Wo Contrast  Result Date: 03/08/2016 CLINICAL DATA:  Chest pain and shortness of breath for 3 days. Cough for 6 weeks. EXAM: CT ANGIOGRAPHY CHEST WITH CONTRAST TECHNIQUE: Multidetector CT imaging of the chest was performed using the standard protocol during bolus administration of intravenous contrast. Multiplanar CT image reconstructions and MIPs were obtained to evaluate the vascular anatomy. CONTRAST:  100 cc Isovue 370 COMPARISON:  None. FINDINGS: Cardiovascular: Heart is enlarged.  No pericardial effusion No thoracic aortic aneurysm. No filling defects in the opacified pulmonary arteries to suggest the presence of an acute pulmonary embolus. Mediastinum/Nodes: No mediastinal  lymphadenopathy. There is no hilar lymphadenopathy. There is no axillary lymphadenopathy. The esophagus has normal imaging features. Lungs/Pleura: There is right base atelectasis or pneumonia. Small right pleural effusion associated. A trace amount a atelectasis and pleural fluid is seen on the left. Upper Abdomen: Incomplete visualization 6 mm left upper pole renal stone. Musculoskeletal: Bone windows reveal no worrisome lytic or sclerotic osseous lesions. As seen on recent x-ray, acute right eighth rib fracture noted. Review of the MIP images confirms the above findings. IMPRESSION: 1. No CT evidence for acute pulmonary embolus. 2. Dependent airspace disease right lung base with small right pleural effusion. Given the presence of an acute right rib fracture, opacity at the right posterior base likely atelectatic secondary to splinting. Superinfection cannot be excluded by CT. 3. Tiny left pleural effusion with some dependent atelectasis in the left lung base. 4. Incomplete visualization left upper pole renal stone. Electronically Signed   By: Kennith Center M.D.   On: 03/08/2016 12:30    ASSESSMENT AND PLAN:   50 year old male with a history of proximal SVT who presented with chronic cough and found to have elevation in troponin and rib fracture.  1. Cardiomyopathy: Echocardiogram is showing EF of 20-25% with 3 large LV masses consistent with thrombus. Unclear etiology of cardiomyopathy. EtOH related versus ischemic in nature. Continue ACE inhibitor and beta blocker. He will need CHF clinic at discharge.  2. LV mural thrombus: Patient will need IV heparin and Coumadin. Once  therapeutic the patient will be able to be discharged. This will likely take 5 days.  Patient will need repeat echocardiogram in 2-3 months after therapeutic anticoagulation.  Goal INR 2.5-3   3. non-ST elevation MI: Due to systolic cardiomyopathy.  Cardiology is not recommending cardiac catheterization at this time due to  large mural thrombus. Eventually he will need cardiac workup with either a stress test or cardiac catheterization. LDL 60. Continue aspirin, atorvastatin, lisinopril and metoprolol. Patient on heparin gtt as well.  4. Acute systolic heart failure: Patient is asymptomatic. Continue metoprolol and ACE inhibitor.  5. Fractured right eighth rib secondary to coughing: Continue supportive care.  6. EtOH abuse: CIWA protocol initiated. Uneventful detox test far  7. Elevated TSH: Order free T4 to evaluate for hypothyroidism versus sick euthyroid.  8. Hypokalemia: Replete and recheck in a.m.  Management plans discussed with the patient and he is in agreement.  CODE STATUS: full  TOTAL TIME TAKING CARE OF THIS PATIENT: 28 minutes.   Discussed with Dr. Pat Patrick and family  POSSIBLE D/C 5 days, DEPENDING ON INR  Janayla Marik M.D on 03/09/2016 at 11:13 AM  Between 7am to 6pm - Pager - [phone] After 6pm go to [email or web address] - Social research officer, government  [facility]  Office  [phone]  CC: Primary care physician; No primary care provider on file.  Note: This dictation was prepared with Dragon dictation along with smaller phrase technology. Any transcriptional errors that result from this process are unintentional.

## 2016-03-09 NOTE — Progress Notes (Signed)
Dr. Juliann Pares is on call for [facility] and will be covering my patients.

## 2016-03-10 LAB — BETA-2-GLYCOPROTEIN I ABS, IGG/M/A
Beta-2 Glyco I IgG: <9 GPI IgG units (ref 0–20)
Beta-2-Glycoprotein I IgA: <9 GPI IgA units (ref 0–25)
Beta-2-Glycoprotein I IgM: <9 GPI IgM units (ref 0–32)

## 2016-03-10 LAB — PROTIME-INR
INR: 1.27
PROTHROMBIN TIME: 16 s — AB (ref 11.4–15.2)

## 2016-03-10 LAB — BASIC METABOLIC PANEL
Anion gap: 8 (ref 5–15)
BUN: 22 mg/dL — ABNORMAL HIGH (ref 6–20)
CHLORIDE: 103 mmol/L (ref 101–111)
CO2: 25 mmol/L (ref 22–32)
CREATININE: 0.88 mg/dL (ref 0.61–1.24)
Calcium: 8.4 mg/dL — ABNORMAL LOW (ref 8.9–10.3)
Glucose, Bld: 125 mg/dL — ABNORMAL HIGH (ref 65–99)
POTASSIUM: 4.3 mmol/L (ref 3.5–5.1)
SODIUM: 136 mmol/L (ref 135–145)

## 2016-03-10 LAB — CBC
HCT: 45.1 % (ref 40.0–52.0)
HEMOGLOBIN: 15 g/dL (ref 13.0–18.0)
MCH: 29.1 pg (ref 26.0–34.0)
MCHC: 33.2 g/dL (ref 32.0–36.0)
MCV: 87.4 fL (ref 80.0–100.0)
PLATELETS: 174 10*3/uL (ref 150–440)
RBC: 5.16 MIL/uL (ref 4.40–5.90)
RDW: 14.2 % (ref 11.5–14.5)
WBC: 7.5 10*3/uL (ref 3.8–10.6)

## 2016-03-10 LAB — HEPARIN LEVEL (UNFRACTIONATED)
HEPARIN UNFRACTIONATED: 0.7 [IU]/mL (ref 0.30–0.70)
Heparin Unfractionated: 0.66 IU/mL (ref 0.30–0.70)

## 2016-03-10 LAB — MAGNESIUM: MAGNESIUM: 2.2 mg/dL (ref 1.7–2.4)

## 2016-03-10 LAB — CARDIOLIPIN ANTIBODIES, IGG, IGM, IGA
Anticardiolipin IgA: <9 APL U/mL (ref 0–11)
Anticardiolipin IgG: <9 GPL U/mL (ref 0–14)
Anticardiolipin IgM: <9 MPL U/mL (ref 0–12)

## 2016-03-10 MED ORDER — CARVEDILOL 6.25 MG PO TABS
6.2500 mg | ORAL_TABLET | Freq: Two times a day (BID) | ORAL | Status: DC
  Administered 2016-03-10 – 2016-03-12 (×4): 6.25 mg via ORAL
  Filled 2016-03-10 (×4): qty 1

## 2016-03-10 NOTE — Progress Notes (Signed)
ANTICOAGULATION CONSULT NOTE -follow up Consult  Pharmacy Consult for heparin drip Indication: LV thrombus  No Known Allergies  Patient Measurements: Height: 5' 5.5" (166.4 cm) Weight: 153 lb 5 oz (69.5 kg) IBW/kg (Calculated) : 62.65 Heparin Dosing Weight: 70 kg  Vital Signs: BP: 114/97 (07/29 0441) Pulse Rate: 95 (07/29 0441)  Labs:  Recent Labs  03/07/16 1515  03/07/16 2109 03/08/16 0135 03/08/16 0542  03/08/16 2300 03/09/16 0426 03/09/16 1035 03/10/16 0536  HGB  --   < >  --   --  15.8  --   --  15.0  --  15.0  HCT  --   < >  --   --  48.3  --   --  45.0  --  45.1  PLT  --   < >  --   --  164  --   --  154  --  174  APTT 28  --   --   --   --   --   --   --   --   --   LABPROT 14.9  --   --   --   --   --   --   --  14.7  --   INR 1.16  --   --   --   --   --   --   --  1.14  --   HEPARINUNFRC  --   --   --  0.21*  --   < > 0.51 0.61  --  0.70  CREATININE  --   < >  --   --  1.23  --   --  1.13  --  0.88  TROPONINI  --   < > 3.78* 2.80* 3.43*  --   --   --   --   --   < > = values in this interval not displayed. Estimated Creatinine Clearance: 88.1 mL/min (by C-G formula based on SCr of 0.88 mg/dL).  Medical History: Past Medical History:  Diagnosis Date  . Enlarged heart   . Paroxysmal SVT (supraventricular tachycardia) (HCC)     Assessment: Pharmacy consulted to dose and monitor heparin drip in this 50 year old male admitted with ACS/NSTEMI, now with LV thromus. Patient was not taking anticoagulants prior to admission and baseline labs were obtained.   Goal of Therapy:  Heparin level 0.3-0.7 units/ml Monitor platelets by anticoagulation protocol: Yes   Plan:  7/27 HL 0.65 is therapeutic. Will continue heparin drip at current rate of 1250 units/hr and order confirmatory HL in 6 hours.   7/27 Confirmatory heparin level 0.51. Continue current regimen and recheck with CBC tomorrow AM  7/28 AM heparin level 0.61. Continue current regimen. Recheck CBC and  heparin level tomorrow AM.  7/29 HL at 0536= 0.70. Level has been steadily increasing, now upper end of goal. Will adjust Heparin to 1150 units/hr. Will check Heaprin level in 6 hrs at 1500. Patient also started on Warfarin per MD on 7/28.  Angelique Blonder, PharmD, BCPS Clinical Pharmacist 03/10/2016,8:25 AM

## 2016-03-10 NOTE — Progress Notes (Signed)
[employer] - [place] at [facility]   PATIENT NAME: Theodore Brandt    MR#:  [id number]  DATE OF BIRTH:  [birth year]  SUBJECTIVE:   No complaints presently.  No chest pain, sob.   REVIEW OF SYSTEMS:    Review of Systems  Constitutional: Negative for chills and fever.  HENT: Negative for congestion and tinnitus.   Eyes: Negative for blurred vision and double vision.  Respiratory: Negative for cough, shortness of breath and wheezing.   Cardiovascular: Negative for chest pain, orthopnea and PND.  Gastrointestinal: Negative for abdominal pain, diarrhea, nausea and vomiting.  Genitourinary: Negative for dysuria and hematuria.  Neurological: Negative for dizziness, sensory change and focal weakness.  All other systems reviewed and are negative.   Nutrition: Heart Healthy Tolerating Diet: Yes Tolerating PT: Ambulatory   DRUG ALLERGIES:  No Known Allergies  VITALS:  Blood pressure 108/77, pulse 95, temperature 97.7 F (36.5 C), temperature source Oral, resp. rate 20, height 5' 5.5" (1.664 m), weight 69.5 kg (153 lb 5 oz), SpO2 99 %.  PHYSICAL EXAMINATION:   Physical Exam  GENERAL:  50 y.o.-year-old patient lying in the bed with no acute distress.  EYES: Pupils equal, round, reactive to light and accommodation. No scleral icterus. Extraocular muscles intact.  HEENT: Head atraumatic, normocephalic. Oropharynx and nasopharynx clear.  NECK:  Supple, no jugular venous distention. No thyroid enlargement, no tenderness.  LUNGS: Normal breath sounds bilaterally, no wheezing, rales, rhonchi. No use of accessory muscles of respiration.  CARDIOVASCULAR: S1, S2 normal. No murmurs, rubs, or gallops.  ABDOMEN: Soft, nontender, nondistended. Bowel sounds present. No organomegaly or mass.  EXTREMITIES: No cyanosis, clubbing, Trace edema b/l.    NEUROLOGIC: Cranial nerves II through XII are intact. No focal Motor or sensory deficits b/l.   PSYCHIATRIC: The patient is alert and  oriented x 3.  SKIN: No obvious rash, lesion, or ulcer.    LABORATORY PANEL:   CBC  Recent Labs Lab 03/10/16 0536  WBC 7.5  HGB 15.0  HCT 45.1  PLT 174   ------------------------------------------------------------------------------------------------------------------  Chemistries   Recent Labs Lab 03/07/16 1641  03/10/16 0536  NA 139  < > 136  K 4.1  < > 4.3  CL 105  < > 103  CO2 25  < > 25  GLUCOSE 106*  < > 125*  BUN 26*  < > 22*  CREATININE 1.22  < > 0.88  CALCIUM 8.8*  < > 8.4*  MG  --   < > 2.2  AST 49*  --   --   ALT 30  --   --   ALKPHOS 103  --   --   BILITOT 1.5*  --   --   < > = values in this interval not displayed. ------------------------------------------------------------------------------------------------------------------  Cardiac Enzymes  Recent Labs Lab 03/08/16 0542  TROPONINI 3.43*   ------------------------------------------------------------------------------------------------------------------  RADIOLOGY:  Ct Head Wo Contrast  Result Date: 03/08/2016 CLINICAL DATA:  Chest pain and shortness of breath. Recent diagnosis of intracardiac thrombus. Evaluate embolic event to brain. EXAM: CT HEAD WITHOUT CONTRAST TECHNIQUE: Contiguous axial images were obtained from the base of the skull through the vertex without intravenous contrast. COMPARISON:  None. FINDINGS: Brain: There is mild generalized age- related parenchymal atrophy with commensurate dilatation of the ventricles and sulci. All areas of the brain demonstrate normal gray-white matter attenuation. There is no mass, hemorrhage, edema or other evidence of acute parenchymal abnormality. No extra-axial hemorrhage. Vascular: No hyperdense vessel or unexpected calcification.  Skull: Negative for fracture or focal lesion. Sinuses/Orbits: No acute findings. Other: None. IMPRESSION: Negative head CT.  No intracranial mass, hemorrhage or edema. Electronically Signed   By: Bary Richard M.D.   On:  03/08/2016 19:33    ASSESSMENT AND PLAN:   50 year old male with past medical history of paroxysmal SVT who presented with a chronic cough and noted to have elevated pulmonary rib fracture.  1. Cardiomyopathy-Patient's echocardiogram showing EF of 20-25% - ?? Ischemic (vs) ETOH related.   - clinically not in CHF.  Cont. Metoprolol, Lisinopril. ?? Change to Coreg given LV dysfunction.   2. Intracardiac/Mural thrombus - due to severe cardiomyopathy. -Continue heparin, Coumadin. Await for INR to get therapeutic. -Appreciate cardiology input.  3. Acute systolic CHF-improved with diuresis and now resolved. -Continue Coreg, lisinopril.  4. Fractured right rib-pain much improved. Continue supportive care.  5. Alcohol abuse-continue CIWA protocol.  All the records are reviewed and case discussed with Care Management/Social Workerr. Management plans discussed with the patient, family and they are in agreement.  CODE STATUS: Full  DVT Prophylaxis: Coumadin  TOTAL TIME TAKING CARE OF THIS PATIENT: 30 minutes.   POSSIBLE D/C IN 2-3 DAYS, DEPENDING ON CLINICAL CONDITION.   Houston Siren M.D on 03/10/2016 at 12:37 PM  Between 7am to 6pm - Pager - [phone]  After 6pm go to [email or web address] - password [email or web address] [facility]  [facility] [place] [facility]  Office  [phone]  CC: Primary care physician; No primary care provider on file.

## 2016-03-10 NOTE — Progress Notes (Addendum)
ANTICOAGULATION CONSULT NOTE -follow up Consult  Pharmacy Consult for heparin drip Indication: LV thrombus  No Known Allergies  Patient Measurements: Height: 5' 5.5" (166.4 cm) Weight: 153 lb 5 oz (69.5 kg) IBW/kg (Calculated) : 62.65 Heparin Dosing Weight: 70 kg  Vital Signs: Temp: 97.7 F (36.5 C) (07/29 1057) Temp Source: Oral (07/29 1057) BP: 108/77 (07/29 1057) Pulse Rate: 95 (07/29 1057)  Labs:  Recent Labs  03/07/16 2109 03/08/16 0135 03/08/16 0542  03/09/16 0426 03/09/16 1035 03/10/16 0536 03/10/16 1500  HGB  --   --  15.8  --  15.0  --  15.0  --   HCT  --   --  48.3  --  45.0  --  45.1  --   PLT  --   --  164  --  154  --  174  --   LABPROT  --   --   --   --   --  14.7 16.0*  --   INR  --   --   --   --   --  1.14 1.27  --   HEPARINUNFRC  --  0.21*  --   < > 0.61  --  0.70 0.66  CREATININE  --   --  1.23  --  1.13  --  0.88  --   TROPONINI 3.78* 2.80* 3.43*  --   --   --   --   --   < > = values in this interval not displayed. Estimated Creatinine Clearance: 88.1 mL/min (by C-G formula based on SCr of 0.88 mg/dL).  Medical History: Past Medical History:  Diagnosis Date  . Enlarged heart   . Paroxysmal SVT (supraventricular tachycardia) (HCC)     Assessment: Pharmacy consulted to dose and monitor heparin drip in this 50 year old male admitted with ACS/NSTEMI, now with LV thromus. Patient was not taking anticoagulants prior to admission and baseline labs were obtained.   Goal of Therapy:  Heparin level 0.3-0.7 units/ml Monitor platelets by anticoagulation protocol: Yes   Plan:  7/27 HL 0.65 is therapeutic. Will continue heparin drip at current rate of 1250 units/hr and order confirmatory HL in 6 hours.   7/27 Confirmatory heparin level 0.51. Continue current regimen and recheck with CBC tomorrow AM  7/28 AM heparin level 0.61. Continue current regimen. Recheck CBC and heparin level tomorrow AM.  7/29 HL at 0536= 0.70. Level has been steadily  increasing, now upper end of goal. Will adjust Heparin to 1150 units/hr. Will check Heparin level in 6 hrs at 1500. Patient also started on Warfarin per MD on 7/28.  7/29 HL at 1500= 0.66. Will continue current rate and recheck HL with am labs.  7/30 AM heparin level 0.48. Continue current regimen. Recheck CBC and heparin level tomorrow AM.  Angelique Blonder, PharmD, BCPS Clinical Pharmacist 03/10/2016,4:25 PM

## 2016-03-10 NOTE — Progress Notes (Signed)
Pharmacy Consult for electrolyte replacement  No Known Allergies  Labs:  Recent Labs  03/07/16 1515  03/08/16 0542 03/09/16 0426 03/09/16 1035 03/10/16 0536  WBC  --   < > 11.8* 8.6  --  7.5  HGB  --   < > 15.8 15.0  --  15.0  HCT  --   < > 48.3 45.0  --  45.1  PLT  --   < > 164 154  --  174  APTT 28  --   --   --   --   --   INR 1.16  --   --   --  1.14  --   < > = values in this interval not displayed.   Recent Labs  03/07/16 1641 03/08/16 0542 03/08/16 0847 03/09/16 0426 03/10/16 0536  NA 139 143  --  140 136  K 4.1 3.9  --  3.3* 4.3  CL 105 106  --  102 103  CO2 25 28  --  30 25  GLUCOSE 106* 90  --  121* 125*  BUN 26* 26*  --  24* 22*  CREATININE 1.22 1.23  --  1.13 0.88  CALCIUM 8.8* 8.4*  --  8.4* 8.4*  MG  --   --   --  2.2 2.2  PROT 6.4*  --   --   --   --   ALBUMIN 3.6  --   --   --   --   AST 49*  --   --   --   --   ALT 30  --   --   --   --   ALKPHOS 103  --   --   --   --   BILITOT 1.5*  --   --   --   --   TRIG  --   --  60  --   --   CHOLHDL  --   --  3.3  --   --   CHOL  --   --  104  --   --    Estimated Creatinine Clearance: 88.1 mL/min (by C-G formula based on SCr of 0.88 mg/dL).   No results for input(s): GLUCAP in the last 72 hours.  Assessment: K=4.3, Mag=2.2  Plan:  Patient on Potassium Chloride PO bid.  will recheck with AM labs  Caniyah Murley A 03/10/2016,8:30 AM

## 2016-03-11 LAB — CBC
HEMATOCRIT: 42.3 % (ref 40.0–52.0)
HEMOGLOBIN: 14.4 g/dL (ref 13.0–18.0)
MCH: 29.5 pg (ref 26.0–34.0)
MCHC: 34 g/dL (ref 32.0–36.0)
MCV: 86.8 fL (ref 80.0–100.0)
Platelets: 161 10*3/uL (ref 150–440)
RBC: 4.88 MIL/uL (ref 4.40–5.90)
RDW: 14.8 % — ABNORMAL HIGH (ref 11.5–14.5)
WBC: 7.3 10*3/uL (ref 3.8–10.6)

## 2016-03-11 LAB — PROTEIN C ACTIVITY: Protein C Activity: 80 % (ref 73–180)

## 2016-03-11 LAB — PROTEIN C, TOTAL: Protein C, Total: 68 % (ref 60–150)

## 2016-03-11 LAB — BASIC METABOLIC PANEL
ANION GAP: 6 (ref 5–15)
BUN: 23 mg/dL — ABNORMAL HIGH (ref 6–20)
CALCIUM: 8.6 mg/dL — AB (ref 8.9–10.3)
CO2: 23 mmol/L (ref 22–32)
Chloride: 109 mmol/L (ref 101–111)
Creatinine, Ser: 0.97 mg/dL (ref 0.61–1.24)
GFR calc non Af Amer: >60 mL/min (ref >60)
Glucose, Bld: 114 mg/dL — ABNORMAL HIGH (ref 65–99)
POTASSIUM: 4.7 mmol/L (ref 3.5–5.1)
Sodium: 138 mmol/L (ref 135–145)

## 2016-03-11 LAB — GLUCOSE, CAPILLARY: GLUCOSE-CAPILLARY: 138 mg/dL — AB (ref 65–99)

## 2016-03-11 LAB — PROTEIN S, TOTAL: PROTEIN S AG TOTAL: 103 % (ref 60–150)

## 2016-03-11 LAB — PROTIME-INR
INR: 1.82
PROTHROMBIN TIME: 21.3 s — AB (ref 11.4–15.2)

## 2016-03-11 LAB — PROTEIN S ACTIVITY: PROTEIN S ACTIVITY: 107 % (ref 63–140)

## 2016-03-11 LAB — HEPARIN LEVEL (UNFRACTIONATED): Heparin Unfractionated: 0.48 IU/mL (ref 0.30–0.70)

## 2016-03-11 MED ORDER — POTASSIUM CHLORIDE CRYS ER 20 MEQ PO TBCR
20.0000 meq | EXTENDED_RELEASE_TABLET | Freq: Every day | ORAL | Status: DC
  Administered 2016-03-11: 20 meq via ORAL
  Filled 2016-03-11 (×2): qty 1

## 2016-03-11 NOTE — Progress Notes (Signed)
[employer] - [place] at [facility]   PATIENT NAME: Theodore Brandt    MR#:  [id number]  DATE OF BIRTH:  1965/12/12  SUBJECTIVE:   No complaints presently.  No acute events overnight.   REVIEW OF SYSTEMS:    Review of Systems  Constitutional: Negative for chills and fever.  HENT: Negative for congestion and tinnitus.   Eyes: Negative for blurred vision and double vision.  Respiratory: Negative for cough, shortness of breath and wheezing.   Cardiovascular: Negative for chest pain, orthopnea and PND.  Gastrointestinal: Negative for abdominal pain, diarrhea, nausea and vomiting.  Genitourinary: Negative for dysuria and hematuria.  Neurological: Negative for dizziness, sensory change and focal weakness.  All other systems reviewed and are negative.   Nutrition: Heart Healthy Tolerating Diet: Yes Tolerating PT: Ambulatory   DRUG ALLERGIES:  No Known Allergies  VITALS:  Blood pressure 112/90, pulse 87, temperature 97.5 F (36.4 C), temperature source Oral, resp. rate 12, height 5' 5.5" (1.664 m), weight 69.5 kg (153 lb 5 oz), SpO2 100 %.  PHYSICAL EXAMINATION:   Physical Exam  GENERAL:  50 y.o.-year-old patient lying in the bed with no acute distress.  EYES: Pupils equal, round, reactive to light and accommodation. No scleral icterus. Extraocular muscles intact.  HEENT: Head atraumatic, normocephalic. Oropharynx and nasopharynx clear.  NECK:  Supple, no jugular venous distention. No thyroid enlargement, no tenderness.  LUNGS: Normal breath sounds bilaterally, no wheezing, rales, rhonchi. No use of accessory muscles of respiration.  CARDIOVASCULAR: S1, S2 normal. No murmurs, rubs, or gallops.  ABDOMEN: Soft, nontender, nondistended. Bowel sounds present. No organomegaly or mass.  EXTREMITIES: No cyanosis, clubbing, Trace edema b/l.    NEUROLOGIC: Cranial nerves II through XII are intact. No focal Motor or sensory deficits b/l.   PSYCHIATRIC: The patient is  alert and oriented x 3.  SKIN: No obvious rash, lesion, or ulcer.    LABORATORY PANEL:   CBC  Recent Labs Lab 03/11/16 0455  WBC 7.3  HGB 14.4  HCT 42.3  PLT 161   ------------------------------------------------------------------------------------------------------------------  Chemistries   Recent Labs Lab 03/07/16 1641  03/10/16 0536 03/11/16 0455  NA 139  < > 136 138  K 4.1  < > 4.3 4.7  CL 105  < > 103 109  CO2 25  < > 25 23  GLUCOSE 106*  < > 125* 114*  BUN 26*  < > 22* 23*  CREATININE 1.22  < > 0.88 0.97  CALCIUM 8.8*  < > 8.4* 8.6*  MG  --   < > 2.2  --   AST 49*  --   --   --   ALT 30  --   --   --   ALKPHOS 103  --   --   --   BILITOT 1.5*  --   --   --   < > = values in this interval not displayed. ------------------------------------------------------------------------------------------------------------------  Cardiac Enzymes  Recent Labs Lab 03/08/16 0542  TROPONINI 3.43*   ------------------------------------------------------------------------------------------------------------------  RADIOLOGY:  No results found.   ASSESSMENT AND PLAN:   50 year old male with past medical history of paroxysmal SVT who presented with a chronic cough and noted to have elevated pulmonary rib fracture.  1. Cardiomyopathy-Patient's echocardiogram showing EF of 20-25% - ?? Ischemic (vs) ETOH related.   - clinically not in CHF.  Cont. Coreg, Lisinopril.   2. Intracardiac/Mural thrombus - due to severe cardiomyopathy. -Continue heparin, Coumadin. Await for INR to get therapeutic. INR today  1.8.   -Appreciate cardiology input.  3. Acute systolic CHF-improved with diuresis and now resolved. -Continue Coreg, lisinopril.  4. Fractured right rib-pain much improved. Continue supportive care.  5. Alcohol abuse- no evidence of withdrawal.  - d/c CIWA.   Likely d/c home tomorrow if INR > 2 tomorrow.   All the records are reviewed and case discussed with  Care Management/Social Workerr. Management plans discussed with the patient, family and they are in agreement.  CODE STATUS: Full  DVT Prophylaxis: Coumadin  TOTAL TIME TAKING CARE OF THIS PATIENT: 30 minutes.   POSSIBLE D/C IN 1-2 DAYS, DEPENDING ON CLINICAL CONDITION.   Houston Siren M.D on 03/11/2016 at 12:58 PM  Between 7am to 6pm - Pager - [phone]  After 6pm go to [email or web address] - password [email or web address] [facility]  [place] [facility]  Office  [phone]  CC: Primary care physician; No primary care provider on file.

## 2016-03-11 NOTE — Progress Notes (Signed)
Pt. Slept throughout the night, productive cough continues. Heparin continues at 11.67ml/hr. Pt is alert and oriented.

## 2016-03-11 NOTE — Progress Notes (Signed)
Pharmacy Consult for electrolyte replacement  Hx ETOH.  No Known Allergies  Labs:  Recent Labs  03/09/16 0426 03/09/16 1035 03/10/16 0536 03/11/16 0455  WBC 8.6  --  7.5 7.3  HGB 15.0  --  15.0 14.4  HCT 45.0  --  45.1 42.3  PLT 154  --  174 161  INR  --  1.14 1.27 1.82     Recent Labs  03/08/16 0847 03/09/16 0426 03/10/16 0536 03/11/16 0455  NA  --  140 136 138  K  --  3.3* 4.3 4.7  CL  --  102 103 109  CO2  --  30 25 23   GLUCOSE  --  121* 125* 114*  BUN  --  24* 22* 23*  CREATININE  --  1.13 0.88 0.97  CALCIUM  --  8.4* 8.4* 8.6*  MG  --  2.2 2.2  --   TRIG 60  --   --   --   CHOLHDL 3.3  --   --   --   CHOL 104  --   --   --    Estimated Creatinine Clearance: 79.9 mL/min (by C-G formula based on SCr of 0.97 mg/dL).   No results for input(s): GLUCAP in the last 72 hours.  Assessment: K=4.7,   7/29: Mag=2.2  Plan:  Patient on Potassium Chloride PO bid.  K increase to 4.7.  Will transition to [facility] po Daily. (also on lisinopril) Will recheck with AM labs  Yasenia Reedy A 03/11/2016,8:18 AM

## 2016-03-11 NOTE — Progress Notes (Signed)
Patient continues on heparin drip 11.5 / hr. No S&S of bleeding. Hohmann's sign is negative. Lungs are clear. Up without assistance within room.

## 2016-03-12 ENCOUNTER — Inpatient Hospital Stay: Payer: BLUE CROSS/BLUE SHIELD

## 2016-03-12 LAB — CBC
HCT: 42.2 % (ref 40.0–52.0)
HEMOGLOBIN: 14.2 g/dL (ref 13.0–18.0)
MCH: 29.7 pg (ref 26.0–34.0)
MCHC: 33.7 g/dL (ref 32.0–36.0)
MCV: 88.2 fL (ref 80.0–100.0)
Platelets: 159 10*3/uL (ref 150–440)
RBC: 4.79 MIL/uL (ref 4.40–5.90)
RDW: 14.7 % — ABNORMAL HIGH (ref 11.5–14.5)
WBC: 9.8 10*3/uL (ref 3.8–10.6)

## 2016-03-12 LAB — BASIC METABOLIC PANEL
Anion gap: 7 (ref 5–15)
BUN: 28 mg/dL — AB (ref 6–20)
CHLORIDE: 108 mmol/L (ref 101–111)
CO2: 21 mmol/L — AB (ref 22–32)
CREATININE: 1.16 mg/dL (ref 0.61–1.24)
Calcium: 8.9 mg/dL (ref 8.9–10.3)
GFR calc Af Amer: >60 mL/min (ref >60)
GFR calc non Af Amer: >60 mL/min (ref >60)
GLUCOSE: 133 mg/dL — AB (ref 65–99)
Potassium: 5.1 mmol/L (ref 3.5–5.1)
SODIUM: 136 mmol/L (ref 135–145)

## 2016-03-12 LAB — PROTIME-INR
INR: 2.81
PROTHROMBIN TIME: 30.2 s — AB (ref 11.4–15.2)

## 2016-03-12 LAB — HEPARIN LEVEL (UNFRACTIONATED): Heparin Unfractionated: 0.48 IU/mL (ref 0.30–0.70)

## 2016-03-12 LAB — LUPUS ANTICOAGULANT PANEL
DRVVT: 45.2 s (ref 0.0–47.0)
PTT Lupus Anticoagulant: 41 s (ref 0.0–51.9)

## 2016-03-12 LAB — HOMOCYSTEINE: HOMOCYSTEINE-NORM: 10 umol/L (ref 0.0–15.0)

## 2016-03-12 MED ORDER — FUROSEMIDE 20 MG PO TABS: 20.0000 mg | ORAL_TABLET | Freq: Every day | ORAL | 1 refills | Status: AC

## 2016-03-12 MED ORDER — CARVEDILOL 6.25 MG PO TABS: 6.2500 mg | ORAL_TABLET | Freq: Two times a day (BID) | ORAL | 1 refills | Status: DC

## 2016-03-12 MED ORDER — WARFARIN SODIUM 5 MG PO TABS: 5.0000 mg | ORAL_TABLET | Freq: Every day | ORAL | 1 refills | Status: AC

## 2016-03-12 MED ORDER — WARFARIN SODIUM 5 MG PO TABS: 5.0000 mg | ORAL_TABLET | Freq: Every day | ORAL | 1 refills | Status: DC

## 2016-03-12 MED ORDER — LISINOPRIL 5 MG PO TABS: 5.0000 mg | ORAL_TABLET | Freq: Every day | ORAL | 1 refills | Status: AC

## 2016-03-12 NOTE — Discharge Summary (Signed)
[employer] - [place] at [facility]   PATIENT NAME: Theodore Brandt    MR#:  [id number]  DATE OF BIRTH:  10-19-1965  DATE OF ADMISSION:  03/07/2016 ADMITTING PHYSICIAN: Houston Siren, MD  DATE OF DISCHARGE: No discharge date for patient encounter.  PRIMARY CARE PHYSICIAN: No primary care provider on file.    ADMISSION DIAGNOSIS:  pain r ribs cough  DISCHARGE DIAGNOSIS:  Active Problems:   NSTEMI (non-ST elevated myocardial infarction) (HCC)   Congestive dilated cardiomyopathy (HCC)   LV (left ventricular) mural thrombus (HCC)   SECONDARY DIAGNOSIS:   Past Medical History:  Diagnosis Date  . Enlarged heart   . Paroxysmal SVT (supraventricular tachycardia) [facility])     HOSPITAL COURSE:   50 year old male with past medical history of paroxysmal SVT who presented with a chronic cough and noted to have rib fracture with elevated Troponin.  1. 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 -He did present with elevated troponin initially thought to be secondary to a non-ST elevation MI but this is all probably demand ischemia also with the fact that he had a mural thrombus. -Patient was in mild CHF on admission and diuresis with IV Lasix and has improved. He is currently being discharged on oral Lasix, Coreg and lisinopril with close follow-up with cardiology as an outpatient.  2. CHF-acute on chronic systolic dysfunction. -Patient was diuresed with IV Lasix and has improved. He is now being discharged on oral Lasix, Coreg and lisinopril with close follow-up with cardiology as an outpatient.  3. Intracardiac/Mural thrombus - he was noted to have a large mural thrombus on echocardiogram. -This is probably secondary to his severe alcoholic cardiomyopathy. -Patient was started on heparin nomogram along with Coumadin. His INR is currently therapeutic at 2.8. He is being discharged with home health  services and to have a PT/INR drawn tomorrow and to follow-up with cardiology as an outpatient.  4. Fractured right rib-this was nontraumatic in nature related to his worsening cough from his Coumadin edema and CHF. -It has improved and he can continue as needed Tylenol as an outpatient.  5. Alcohol abuse-on the hospital patient was maintained on CIWA protocol. He is currently not showing any signs of alcohol withdrawal and was strongly advised to abstain from alcohol upon discharge.   DISCHARGE CONDITIONS:   Stable  CONSULTS OBTAINED:  Treatment Team:  Dalia Heading, MD Alwyn Pea, MD  DRUG ALLERGIES:  No Known Allergies  DISCHARGE MEDICATIONS:   Current Discharge Medication List    START taking these medications   Details  carvedilol (COREG) 6.25 MG tablet Take 1 tablet (6.25 mg total) by mouth 2 (two) times daily with a meal. Qty: 60 tablet, Refills: 1    lisinopril (PRINIVIL,ZESTRIL) 5 MG tablet Take 1 tablet (5 mg total) by mouth daily. Qty: 30 tablet, Refills: 1    warfarin (COUMADIN) 5 MG tablet Take 1 tablet (5 mg total) by mouth daily at 6 PM. Qty: 30 tablet, Refills: 1      CONTINUE these medications which have CHANGED   Details  furosemide (LASIX) 20 MG tablet Take 1 tablet (20 mg total) by mouth daily. Qty: 30 tablet, Refills: 1      STOP taking these medications     metoprolol succinate (TOPROL-XL) 25 MG 24 hr tablet      minocycline (MINOCIN,DYNACIN) 100 MG capsule          DISCHARGE  INSTRUCTIONS:   DIET:  Cardiac diet  DISCHARGE CONDITION:  Stable  ACTIVITY:  Activity as tolerated  OXYGEN:  Home Oxygen: No.   Oxygen Delivery: room air  DISCHARGE LOCATION:  Home with home health nursing   If you experience worsening of your admission symptoms, develop shortness of breath, life threatening emergency, suicidal or homicidal thoughts you must seek medical attention immediately by calling 911 or calling your MD immediately  if  symptoms less severe.  You Must read complete instructions/literature along with all the possible adverse reactions/side effects for all the Medicines you take and that have been prescribed to you. Take any new Medicines after you have completely understood and accpet all the possible adverse reactions/side effects.   Please note  You were cared for by a hospitalist during your hospital stay. If you have any questions about your discharge medications or the care you received while you were in the hospital after you are discharged, you can call the unit and asked to speak with the hospitalist on call if the hospitalist that took care of you is not available. Once you are discharged, your primary care physician will handle any further medical issues. Please note that NO REFILLS for any discharge medications will be authorized once you are discharged, as it is imperative that you return to your primary care physician (or establish a relationship with a primary care physician if you do not have one) for your aftercare needs so that they can reassess your need for medications and monitor your lab values.     Today   Shortness of breath improved. Still having some pain near his rib fracture site. No nausea, vomiting. INR therapeutic today.  VITAL SIGNS:  Blood pressure (!) 121/93, pulse 96, temperature 97.3 F (36.3 C), temperature source Oral, resp. rate 17, height 5' 5.5" (1.664 m), weight 69.5 kg (153 lb 5 oz), SpO2 97 %.  I/O:   Intake/Output Summary (Last 24 hours) at 03/12/16 1417 Last data filed at 03/12/16 1130  Gross per 24 hour  Intake            126.5 ml  Output              700 ml  Net           -573.5 ml    PHYSICAL EXAMINATION:  GENERAL:  50 y.o.-year-old patient lying in the bed with no acute distress.  EYES: Pupils equal, round, reactive to light and accommodation. No scleral icterus. Extraocular muscles intact.  HEENT: Head atraumatic, normocephalic. Oropharynx and  nasopharynx clear.  NECK:  Supple, no jugular venous distention. No thyroid enlargement, no tenderness.  LUNGS: Normal breath sounds bilaterally, no wheezing, rales,rhonchi. No use of accessory muscles of respiration.  CARDIOVASCULAR: S1, S2 normal. II/VI SEM at base, No rubs, or gallops.  ABDOMEN: Soft, non-tender, non-distended. Bowel sounds present. No organomegaly or mass.  EXTREMITIES: Trace edema b/l, No cyanosis, or clubbing.  NEUROLOGIC: Cranial nerves II through XII are intact. No focal motor or sensory defecits b/l.  PSYCHIATRIC: The patient is alert and oriented x 3. Good affect.  SKIN: No obvious rash, lesion, or ulcer.   DATA REVIEW:   CBC  Recent Labs Lab 03/12/16 0556  WBC 9.8  HGB 14.2  HCT 42.2  PLT 159    Chemistries   Recent Labs Lab 03/07/16 1641  03/10/16 0536  03/12/16 0556  NA 139  < > 136  < > 136  K 4.1  < > 4.3  < >  5.1  CL 105  < > 103  < > 108  CO2 25  < > 25  < > 21*  GLUCOSE 106*  < > 125*  < > 133*  BUN 26*  < > 22*  < > 28*  CREATININE 1.22  < > 0.88  < > 1.16  CALCIUM 8.8*  < > 8.4*  < > 8.9  MG  --   < > 2.2  --   --   AST 49*  --   --   --   --   ALT 30  --   --   --   --   ALKPHOS 103  --   --   --   --   BILITOT 1.5*  --   --   --   --   < > = values in this interval not displayed.  Cardiac Enzymes  Recent Labs Lab 03/08/16 0542  TROPONINI 3.43*    Microbiology Results  No results found for this or any previous visit.  RADIOLOGY:  Dg Abd 1 View  Result Date: 03/12/2016 CLINICAL DATA:  Left-sided flank pain for 1 day, initial encounter EXAM: ABDOMEN - 1 VIEW COMPARISON:  None. FINDINGS: Scattered large and small bowel gas is noted. No abnormal mass is seen. No free air is noted. There is a questionable density in the left mid abdomen which may represent an upper pole renal calculus. L4 compression deformity is noted stable by report from 1999. IMPRESSION: No acute abnormality noted. Questionable left renal calculus. Stable  L4 compression deformity Electronically Signed   By: Alcide Clever M.D.   On: 03/12/2016 09:57     Management plans discussed with the patient, family and they are in agreement.  CODE STATUS:     Code Status Orders        Start     Ordered   03/07/16 2043  Full code  Continuous     03/07/16 2042    Code Status History    Date Active Date Inactive Code Status Order ID Comments User Context   03/07/2016  8:42 PM 03/07/2016  9:07 PM Full Code [id number]  Houston Siren, MD Inpatient    Advance Directive Documentation   Flowsheet Row Most Recent Value  Type of Advance Directive  Healthcare Power of Attorney  Pre-existing out of facility DNR order (yellow form or pink MOST form)  No data  "MOST" Form in Place?  No data      TOTAL TIME TAKING CARE OF THIS PATIENT: 40 minutes.    Houston Siren M.D on 03/12/2016 at 2:17 PM  Between 7am to 6pm - Pager - [phone]  After 6pm go to [email or web address] - password [email or web address] [facility]  [facility]  Office  [phone]  CC: Primary care physician; No primary care provider on file.

## 2016-03-12 NOTE — Progress Notes (Signed)
ANTICOAGULATION CONSULT NOTE -follow up Consult  Pharmacy Consult for heparin drip Indication: LV thrombus  No Known Allergies  Patient Measurements: Height: 5' 5.5" (166.4 cm) Weight: 153 lb 5 oz (69.5 kg) IBW/kg (Calculated) : 62.65 Heparin Dosing Weight: 70 kg  Vital Signs: Temp: 98.3 F (36.8 C) (07/31 0419) BP: 116/89 (07/31 0419) Pulse Rate: 95 (07/31 0419)  Labs:  Recent Labs  03/10/16 0536 03/10/16 1500 03/11/16 0455 03/12/16 0556  HGB 15.0  --  14.4 14.2  HCT 45.1  --  42.3 42.2  PLT 174  --  161 159  LABPROT 16.0*  --  21.3* 30.2*  INR 1.27  --  1.82 2.81  HEPARINUNFRC 0.70 0.66 0.48 0.48  CREATININE 0.88  --  0.97 1.16   Estimated Creatinine Clearance: 66.8 mL/min (by C-G formula based on SCr of 1.16 mg/dL).  Medical History: Past Medical History:  Diagnosis Date  . Enlarged heart   . Paroxysmal SVT (supraventricular tachycardia) (HCC)     Assessment: Pharmacy consulted to dose and monitor heparin drip in this 50 year old male admitted with ACS/NSTEMI, now with LV thromus. Patient was not taking anticoagulants prior to admission and baseline labs were obtained.   Goal of Therapy:  Heparin level 0.3-0.7 units/ml Monitor platelets by anticoagulation protocol: Yes   Plan:  7/27 HL 0.65 is therapeutic. Will continue heparin drip at current rate of 1250 units/hr and order confirmatory HL in 6 hours.   7/27 Confirmatory heparin level 0.51. Continue current regimen and recheck with CBC tomorrow AM  7/28 AM heparin level 0.61. Continue current regimen. Recheck CBC and heparin level tomorrow AM.  7/29 HL at 0536= 0.70. Level has been steadily increasing, now upper end of goal. Will adjust Heparin to 1150 units/hr. Will check Heparin level in 6 hrs at 1500. Patient also started on Warfarin per MD on 7/28.  7/29 HL at 1500= 0.66. Will continue current rate and recheck HL with am labs.  7/30 AM heparin level 0.48. Continue current regimen. Recheck CBC and  heparin level tomorrow AM.  7/31 AM heparin level 0.48. Continue current regimen. Recheck CBC and heparin level tomorrow AM.   Erich Montane, PharmD, BCPS Clinical Pharmacist 03/12/2016,6:56 AM

## 2016-03-12 NOTE — Progress Notes (Signed)
Discharge instructions explained to pt and pts family/ verbalized an understanding/ iv and tele removed/ followup apt made and given to pt and pts family/ rx given to pt/ will transport off unit when ride arrives

## 2016-03-12 NOTE — Progress Notes (Signed)
Pharmacy Consult for electrolyte replacement  Hx ETOH.  No Known Allergies  Labs:  Recent Labs  03/10/16 0536 03/11/16 0455 03/12/16 0556  WBC 7.5 7.3 9.8  HGB 15.0 14.4 14.2  HCT 45.1 42.3 42.2  PLT 174 161 159  INR 1.27 1.82 2.81     Recent Labs  03/10/16 0536 03/11/16 0455 03/12/16 0556  NA 136 138 136  K 4.3 4.7 5.1  CL 103 109 108  CO2 25 23 21*  GLUCOSE 125* 114* 133*  BUN 22* 23* 28*  CREATININE 0.88 0.97 1.16  CALCIUM 8.4* 8.6* 8.9  MG 2.2  --   --    Estimated Creatinine Clearance: 66.8 mL/min (by C-G formula based on SCr of 1.16 mg/dL).    Recent Labs  03/11/16 1217  GLUCAP 138*    Assessment: K=5.1,   7/29: Mag=2.2  Plan:  Patient on Potassium Chloride PO daily.  K increase to 5.1.  Will discontinue KCl supplementation. Will recheck with AM labs  Clovia Cuff, PharmD, BCPS 03/12/2016 11:52 AM

## 2016-03-12 NOTE — Care Management (Signed)
Attending states patient will discharge home today and will be in need of home health follow up.  Provided patient with list of home heatlh agencies and agency preference is [facility].  Referral called and accepted for nursing- to monitor med administration, compliance,assessment and management of heart failure and education..  Patient need protime drawn and SN visit on 8/1.  Patient has an appointment scheduled with Dr Terance Hart to get established as a new patient in September.  Dr Gwen Pounds to sign home health orders initially and follow protimes.    Patient does not show much interest in being active participant in his plan of care.  His mother answers all questions.  Discussed with patient and his mother that patient needs to actively participate in plan of care.  Street address of residence location is [address] [place] [address] [address] [place].  Patient's cell pone number is [phone].  Land line number is [phone].  a referral has been made to the CHF clinic

## 2016-03-13 ENCOUNTER — Encounter: Payer: Self-pay | Admitting: Emergency Medicine

## 2016-03-13 ENCOUNTER — Emergency Department
Admission: EM | Admit: 2016-03-13 | Discharge: 2016-03-13 | Disposition: A | Discharge status: Home / Self Care | Payer: BLUE CROSS/BLUE SHIELD | Attending: Emergency Medicine | Admitting: Emergency Medicine

## 2016-03-13 DIAGNOSIS — Z87891 Personal history of nicotine dependence: Secondary | ICD-10-CM | POA: Diagnosis not present

## 2016-03-13 DIAGNOSIS — R791 Abnormal coagulation profile: Secondary | ICD-10-CM | POA: Insufficient documentation

## 2016-03-13 DIAGNOSIS — I214 Non-ST elevation (NSTEMI) myocardial infarction: Secondary | ICD-10-CM | POA: Diagnosis not present

## 2016-03-13 DIAGNOSIS — Z048 Encounter for examination and observation for other specified reasons: Secondary | ICD-10-CM | POA: Diagnosis present

## 2016-03-13 DIAGNOSIS — Z79899 Other long term (current) drug therapy: Secondary | ICD-10-CM | POA: Diagnosis not present

## 2016-03-13 DIAGNOSIS — Z7901 Long term (current) use of anticoagulants: Secondary | ICD-10-CM | POA: Insufficient documentation

## 2016-03-13 LAB — PROTIME-INR
INR: 5.89 — AB
Prothrombin Time: 54.5 seconds — ABNORMAL HIGH (ref 11.4–15.2)

## 2016-03-13 NOTE — ED Provider Notes (Signed)
[facility] Emergency Department Provider Note   ____________________________________________   I have reviewed the triage vital signs and the nursing notes.   HISTORY  Chief Complaint Abnormal Lab   History limited by: Not Limited   HPI Theodore Brandt is a 50 y.o. male who presents to the emergency department today at the request of home health nurse because of elevated INR. The patient was recently started on warfarin for a mural thrombus. He took his dose last night. Recheck today show an INR of 8. The patient denies any bleeding. Denies black tarry stools. Denies bloody stools. Denies coughing up any blood. No headaches.  Past Medical History:  Diagnosis Date  . Enlarged heart   . Paroxysmal SVT (supraventricular tachycardia) [facility])     Patient Active Problem List   Diagnosis Date Noted  . 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 LV (left ventricular) mural thrombus (HCC) 03/09/2016  . NSTEMI (non-ST elevated myocardial infarction) (HCC) 03/07/2016    Past Surgical History:  Procedure Laterality Date  . arm surgery    . TONSILLECTOMY      Prior to Admission medications   Medication Sig Start Date End Date Taking? Authorizing Provider  carvedilol (COREG) 6.25 MG tablet Take 1 tablet (6.25 mg total) by mouth 2 (two) times daily with a meal. 03/12/16   Houston Siren, MD  furosemide (LASIX) 20 MG tablet Take 1 tablet (20 mg total) by mouth daily. 03/12/16   Houston Siren, MD  lisinopril (PRINIVIL,ZESTRIL) 5 MG tablet Take 1 tablet (5 mg total) by mouth daily. 03/12/16   Houston Siren, MD  warfarin (COUMADIN) 5 MG tablet Take 1 tablet (5 mg total) by mouth daily at 6 PM. 03/12/16   Houston Siren, MD    Allergies Review of patient's allergies indicates no known allergies.  Family History  Problem Relation Age of Onset  . Diabetes Mother   . Hemochromatosis Father     Social History Social History  Substance Use Topics   . Smoking status: Former Smoker    Types: Cigars  . Smokeless tobacco: Never Used  . Alcohol use Yes     Comment: Used to drink a 6-12 pack daily.      Review of Systems  Constitutional: Negative for fever. Cardiovascular: Negative for chest pain. Respiratory: Negative for shortness of breath. Gastrointestinal: Negative for abdominal pain, vomiting and diarrhea. Neurological: Negative for headaches, focal weakness or numbness.   10-point ROS otherwise negative.  ____________________________________________   PHYSICAL EXAM:  VITAL SIGNS: ED Triage Vitals  Enc Vitals Group     BP 03/13/16 1644 106/65     Pulse Rate 03/13/16 1644 (!) 111     Resp 03/13/16 1644 18     Temp 03/13/16 1644 97.9 F (36.6 C)     Temp Source 03/13/16 1644 Oral     SpO2 03/13/16 1644 100 %     Weight 03/13/16 1644 160 lb (72.6 kg)     Height 03/13/16 1644 5\' 5"  (1.651 m)     Head Circumference --      Peak Flow --      Pain Score 03/13/16 1645 0   Constitutional: Alert and oriented. Well appearing and in no distress. Eyes: Conjunctivae are normal. PERRL. Normal extraocular movements. ENT   Head: Normocephalic and atraumatic.   Nose: No congestion/rhinnorhea.   Mouth/Throat: Mucous membranes are moist.   Neck: No stridor. Cardiovascular: Normal rate, regular rhythm.  No murmurs, rubs, or gallops.  Respiratory: Normal respiratory effort without tachypnea nor retractions. Breath sounds are clear and equal bilaterally. No wheezes/rales/rhonchi. Gastrointestinal: Soft and nontender. No distention. Genitourinary: Deferred Musculoskeletal: Normal range of motion in all extremities. No joint effusions.   Neurologic:  Normal speech and language. No gross focal neurologic deficits are appreciated.  Skin:  Skin is warm, dry and intact. No rash noted. Psychiatric: Mood and affect are normal. Speech and behavior are normal. Patient exhibits appropriate insight and  judgment.  ____________________________________________    LABS (pertinent positives/negatives)  Labs Reviewed  PROTIME-INR - Abnormal; Notable for the following:       Result Value   Prothrombin Time 54.5 (*)    INR 5.89 (*)    All other components within normal limits     ____________________________________________   EKG  None  ____________________________________________    RADIOLOGY  None  ____________________________________________   PROCEDURES  Procedures  ____________________________________________   INITIAL IMPRESSION / ASSESSMENT AND PLAN / ED COURSE  Pertinent labs & imaging results that were available during my care of the patient were reviewed by me and considered in my medical decision making (see chart for details).  Patient presented to the emergency department today because of concerns for supratherapeutic INR of 8. No bleeding. Patient's INR at lab check here 5.8. Given the patient is not having any active bleeding and I do not feel he requires any acute reversal. I did discuss with patient and family the importance of not taking his warfarin tonight. Additionally I advised them to discuss with their nurse about rechecking INR tomorrow to further help plan management. Additionally I discussed return precautions for any bleeding or head trauma.  Clinical Course    ____________________________________________   FINAL CLINICAL IMPRESSION(S) / ED DIAGNOSES  Final diagnoses:  Elevated INR     Note: This dictation was prepared with Dragon dictation. Any transcriptional errors that result from this process are unintentional    Phineas Semen, MD 03/13/16 1806

## 2016-03-13 NOTE — ED Triage Notes (Signed)
MD told pt to come in bc INR level was high.  Pt denies any complaints

## 2016-03-13 NOTE — Discharge Instructions (Signed)
Please seek medical attention for any high fevers, chest pain, shortness of breath, change in behavior, persistent vomiting, bloody stool or any other new or concerning symptoms.  

## 2016-03-15 LAB — FACTOR 5 LEIDEN

## 2016-03-16 LAB — PROTHROMBIN GENE MUTATION

## 2016-03-23 ENCOUNTER — Ambulatory Visit: Payer: BLUE CROSS/BLUE SHIELD | Attending: Family | Admitting: Family

## 2016-03-23 ENCOUNTER — Ambulatory Visit: Payer: BLUE CROSS/BLUE SHIELD | Admitting: Family

## 2016-03-23 ENCOUNTER — Encounter: Payer: Self-pay | Admitting: Family

## 2016-03-23 VITALS — BP 125/90 | HR 103 | Resp 18 | Ht 66.0 in | Wt 148.0 lb

## 2016-03-23 DIAGNOSIS — I2510 Atherosclerotic heart disease of native coronary artery without angina pectoris: Secondary | ICD-10-CM | POA: Insufficient documentation

## 2016-03-23 DIAGNOSIS — Z79899 Other long term (current) drug therapy: Secondary | ICD-10-CM | POA: Insufficient documentation

## 2016-03-23 DIAGNOSIS — I213 ST elevation (STEMI) myocardial infarction of unspecified site: Secondary | ICD-10-CM | POA: Diagnosis not present

## 2016-03-23 DIAGNOSIS — Z833 Family history of diabetes mellitus: Secondary | ICD-10-CM | POA: Insufficient documentation

## 2016-03-23 DIAGNOSIS — R0602 Shortness of breath: Secondary | ICD-10-CM | POA: Diagnosis not present

## 2016-03-23 DIAGNOSIS — Z8489 Family history of other specified conditions: Secondary | ICD-10-CM | POA: Insufficient documentation

## 2016-03-23 DIAGNOSIS — I509 Heart failure, unspecified: Secondary | ICD-10-CM | POA: Insufficient documentation

## 2016-03-23 DIAGNOSIS — Z9889 Other specified postprocedural states: Secondary | ICD-10-CM | POA: Insufficient documentation

## 2016-03-23 DIAGNOSIS — Z87891 Personal history of nicotine dependence: Secondary | ICD-10-CM | POA: Insufficient documentation

## 2016-03-23 DIAGNOSIS — I513 Intracardiac thrombosis, not elsewhere classified: Secondary | ICD-10-CM

## 2016-03-23 DIAGNOSIS — R000 Tachycardia, unspecified: Secondary | ICD-10-CM | POA: Insufficient documentation

## 2016-03-23 DIAGNOSIS — I5022 Chronic systolic (congestive) heart failure: Secondary | ICD-10-CM | POA: Insufficient documentation

## 2016-03-23 NOTE — Patient Instructions (Signed)
Continue weighing daily and call for an overnight weight gain of > 2 pounds or a weekly weight gain of >5 pounds. 

## 2016-03-23 NOTE — Progress Notes (Signed)
Subjective:    Patient ID: Theodore Brandt, male    DOB: 03/25/1966, 50 y.o.   MRN: [id number]  Congestive Heart Failure  Presents for initial visit. The disease course has been improving. Associated symptoms include fatigue, orthopnea and shortness of breath. Pertinent negatives include no abdominal pain, chest pain, chest pressure, edema or palpitations. The symptoms have been improving. Past treatments include ACE inhibitors, beta blockers and salt and fluid restriction. The treatment provided moderate relief. Compliance with prior treatments has been good. His past medical history is significant for CAD. There is no history of DM, HTN or PE.  Shortness of Breath  This is a new problem. The current episode started more than 1 month ago. The problem occurs daily. The problem has been gradually improving. Associated symptoms include leg swelling. Pertinent negatives include no abdominal pain, chest pain, headaches, neck pain, rhinorrhea or sore throat. The symptoms are aggravated by weather changes, lying flat and exercise. The patient has no known risk factors for DVT/PE. He has tried body position changes for the symptoms. The treatment provided moderate relief. His past medical history is significant for CAD and a heart failure. There is no history of COPD or PE.   Past Medical History:  Diagnosis Date  . Apical mural thrombus (HCC) 2017  . CHF (congestive heart failure) (HCC)   . Coronary artery disease    MI  . Enlarged heart   . Paroxysmal SVT (supraventricular tachycardia) (HCC)     Past Surgical History:  Procedure Laterality Date  . arm surgery    . TONSILLECTOMY      Family History  Problem Relation Age of Onset  . Diabetes Mother   . Hemochromatosis Father     Social History  Substance Use Topics  . Smoking status: Former Smoker    Types: Cigars  . Smokeless tobacco: Never Used  . Alcohol use Yes     Comment: Used to drink a 6-12 pack daily.      No Known  Allergies  Prior to Admission medications   Medication Sig Start Date End Date Taking? Authorizing Provider  carvedilol (COREG) 6.25 MG tablet Take 1 tablet (6.25 mg total) by mouth 2 (two) times daily with a meal. Patient taking differently: Take 12.5 mg by mouth 2 (two) times daily with a meal.  03/12/16  Yes Houston Siren, MD  furosemide (LASIX) 20 MG tablet Take 1 tablet (20 mg total) by mouth daily. 03/12/16  Yes Houston Siren, MD  lisinopril (PRINIVIL,ZESTRIL) 5 MG tablet Take 1 tablet (5 mg total) by mouth daily. 03/12/16  Yes Houston Siren, MD  warfarin (COUMADIN) 5 MG tablet Take 1 tablet (5 mg total) by mouth daily at 6 PM. Patient taking differently: Take 2.5 mg by mouth daily at 6 PM.  03/12/16   Houston Siren, MD      Review of Systems  Constitutional: Positive for fatigue. Negative for appetite change.  HENT: Negative for congestion, rhinorrhea and sore throat.   Eyes: Negative.   Respiratory: Positive for cough ("getting better") and shortness of breath. Negative for chest tightness.   Cardiovascular: Positive for leg swelling. Negative for chest pain and palpitations.  Gastrointestinal: Negative for abdominal distention and abdominal pain.  Endocrine: Negative.   Genitourinary: Negative.   Musculoskeletal: Negative for back pain and neck pain.  Skin: Negative.   Allergic/Immunologic: Negative.   Neurological: Negative for dizziness, light-headedness and headaches.  Hematological: Negative for adenopathy. Bruises/bleeds easily.  Psychiatric/Behavioral: Negative  for dysphoric mood and sleep disturbance (sleeping on 4 pillows). The patient is not nervous/anxious.        Objective:   Physical Exam  Constitutional: He is oriented to person, place, and time. He appears well-developed and well-nourished.  HENT:  Head: Normocephalic and atraumatic.  Eyes: Conjunctivae are normal. Pupils are equal, round, and reactive to light.  Neck: Normal range of motion. Neck  supple.  Cardiovascular: Regular rhythm.  Tachycardia present.   Pulmonary/Chest: Effort normal. He has no wheezes. He has no rales.  Abdominal: Soft. He exhibits no distension. There is no tenderness.  Musculoskeletal: He exhibits no edema or tenderness.  Neurological: He is alert and oriented to person, place, and time.  Skin: Skin is warm and dry.  Psychiatric: He has a normal mood and affect. His behavior is normal. Thought content normal.  Nursing note and vitals reviewed.   BP 125/90   Pulse (!) 103   Resp 18   Ht 5\' 6"  (1.676 m)   Wt 148 lb (67.1 kg)   SpO2 100%   BMI 23.89 kg/m        Assessment & Plan:  1: Chronic heart failure with reduced ejection fraction- Patient presents with fatigue and shortness of breath with moderate exertion (Class II). Symptoms do improve upon rest. He does have a continued cough which he does feel like is improving. Denies any swelling in his legs or abdomen. Is already weighing himself daily and says that his weight has been stable. Discussed the importance of calling for an overnight weight gain of >2 pounds or a weekly weight gain of >5 pounds. He does not add salt to his food. Reviewed the importance of closely following a 2000mg  sodium diet as well as how to read food labels. Written dietary information was given to him as well. [facility] and reviewed medications with the patient and his parents. Could consider changing his lisinopril to entresto.  2: LV mural thrombus- Patient is currently taking warfarin daily and is being followed closely by cardiology. Was last seen by cardiology on 03/19/16. 3: Tachycardia- Heart rate in the 100's. Carvedilol has recently been increased to 12.5mg  BID. He's currently finishing out his 6.25mg  dose by taking 2 tablets twice daily. When he needs a new prescription, the 12.5mg  dosage needs to be called in.  Medication bottles were reviewed.  Return here in 1 month or sooner for any questions/problems  before then.

## 2016-04-02 ENCOUNTER — Ambulatory Visit: Payer: BLUE CROSS/BLUE SHIELD | Admitting: Family

## 2016-04-19 ENCOUNTER — Ambulatory Visit: Payer: BLUE CROSS/BLUE SHIELD | Attending: Family | Admitting: Family

## 2016-04-19 ENCOUNTER — Encounter: Payer: Self-pay | Admitting: Family

## 2016-04-19 VITALS — BP 90/60 | HR 85 | Resp 18 | Ht 66.0 in | Wt 138.0 lb

## 2016-04-19 DIAGNOSIS — I252 Old myocardial infarction: Secondary | ICD-10-CM | POA: Diagnosis not present

## 2016-04-19 DIAGNOSIS — I471 Supraventricular tachycardia: Secondary | ICD-10-CM | POA: Diagnosis not present

## 2016-04-19 DIAGNOSIS — Z833 Family history of diabetes mellitus: Secondary | ICD-10-CM | POA: Insufficient documentation

## 2016-04-19 DIAGNOSIS — I517 Cardiomegaly: Secondary | ICD-10-CM | POA: Insufficient documentation

## 2016-04-19 DIAGNOSIS — Z7902 Long term (current) use of antithrombotics/antiplatelets: Secondary | ICD-10-CM | POA: Insufficient documentation

## 2016-04-19 DIAGNOSIS — I213 ST elevation (STEMI) myocardial infarction of unspecified site: Secondary | ICD-10-CM | POA: Insufficient documentation

## 2016-04-19 DIAGNOSIS — I5022 Chronic systolic (congestive) heart failure: Secondary | ICD-10-CM

## 2016-04-19 DIAGNOSIS — Z87891 Personal history of nicotine dependence: Secondary | ICD-10-CM | POA: Diagnosis not present

## 2016-04-19 DIAGNOSIS — I950 Idiopathic hypotension: Secondary | ICD-10-CM | POA: Insufficient documentation

## 2016-04-19 DIAGNOSIS — I513 Intracardiac thrombosis, not elsewhere classified: Secondary | ICD-10-CM

## 2016-04-19 NOTE — Progress Notes (Signed)
Subjective:    Patient ID: Theodore Brandt, male    DOB: [birth year], 50 y.o.   MRN: [id number]  Congestive Heart Failure  Presents for follow-up visit. The disease course has been stable. Associated symptoms include fatigue. Pertinent negatives include no abdominal pain, chest pain, edema, orthopnea, palpitations or shortness of breath. The symptoms have been improving. Past treatments include beta blockers, salt and fluid restriction and ACE inhibitors. The treatment provided significant relief. Compliance with prior treatments has been good. His past medical history is significant for CAD. There is no history of DM or HTN.  Other  This is a new (hypotension) problem. The current episode started in the past 7 days. The problem occurs daily. Associated symptoms include fatigue. Pertinent negatives include no abdominal pain, chest pain, congestion, coughing, neck pain, sore throat or weakness. Nothing aggravates the symptoms. He has tried nothing for the symptoms.    Past Medical History:  Diagnosis Date  . Apical mural thrombus (HCC) 2017  . CHF (congestive heart failure) (HCC)   . Coronary artery disease    MI  . Enlarged heart   . Paroxysmal SVT (supraventricular tachycardia) (HCC)     Past Surgical History:  Procedure Laterality Date  . arm surgery    . TONSILLECTOMY      Family History  Problem Relation Age of Onset  . Diabetes Mother   . Hemochromatosis Father     Social History  Substance Use Topics  . Smoking status: Former Smoker    Types: Cigars  . Smokeless tobacco: Never Used  . Alcohol use Yes     Comment: Used to drink a 6-12 pack daily.      No Known Allergies  Prior to Admission medications   Medication Sig Start Date End Date Taking? Authorizing Provider  carvedilol (COREG) 25 MG tablet Take 25 mg by mouth 2 (two) times daily with a meal.   Yes Historical Provider, MD  furosemide (LASIX) 20 MG tablet Take 1 tablet (20 mg total) by mouth  daily. Patient taking differently: Take 20 mg by mouth daily as needed.  03/12/16  Yes Houston Siren, MD  lisinopril (PRINIVIL,ZESTRIL) 5 MG tablet Take 1 tablet (5 mg total) by mouth daily. 03/12/16  Yes Houston Siren, MD  warfarin (COUMADIN) 5 MG tablet Take 1 tablet (5 mg total) by mouth daily at 6 PM. Patient taking differently: Take 2.5 mg by mouth daily at 6 PM.  03/12/16  Yes Houston Siren, MD     Review of Systems  Constitutional: Positive for fatigue. Negative for appetite change.  HENT: Negative for congestion, sinus pressure and sore throat.   Eyes: Negative.   Respiratory: Negative for cough, chest tightness and shortness of breath.   Cardiovascular: Negative for chest pain, palpitations and leg swelling.  Gastrointestinal: Negative for abdominal distention and abdominal pain.  Endocrine: Negative.   Genitourinary: Negative.   Musculoskeletal: Negative for back pain and neck pain.  Skin: Negative.   Allergic/Immunologic: Negative.   Neurological: Negative for dizziness, weakness and light-headedness.  Hematological: Negative for adenopathy. Does not bruise/bleed easily.  Psychiatric/Behavioral: Negative for dysphoric mood and sleep disturbance (sleeping on 4 pillows for comfort). The patient is not nervous/anxious.        Objective:   Physical Exam  Constitutional: He is oriented to person, place, and time. He appears well-developed and well-nourished.  HENT:  Head: Normocephalic and atraumatic.  Eyes: Conjunctivae are normal. Pupils are equal, round, and reactive to light.  Neck: Normal range of motion. Neck supple.  Cardiovascular: Normal rate and regular rhythm.   Pulmonary/Chest: Effort normal. He has no wheezes. He has no rales.  Abdominal: Soft. He exhibits no distension. There is no tenderness.  Musculoskeletal: He exhibits no edema or tenderness.  Neurological: He is alert and oriented to person, place, and time.  Skin: Skin is dry.  Psychiatric: He has a  normal mood and affect. His behavior is normal. Thought content normal.  Nursing note and vitals reviewed.   BP 90/60   Pulse 85   Resp 18   Ht 5\' 6"  (1.676 m)   Wt 138 lb (62.6 kg)   SpO2 100%   BMI 22.27 kg/m        Assessment & Plan:  1:Chronic heart failure with reduced ejection fraction- Patient presents with a mild amount of fatigue with moderate exertion (Class II). Fatigue quickly improves upon rest. Denies any shortness of breath or swelling in his legs or abdomen. Continues to weigh himself and reports a weight loss because he's trying to eat better. By our scale, he's lost 9.2 pounds since he was last here. Reminded to call for an overnight weight gain of >2 pounds or a weekly weight gain of >5 pounds. He is not adding salt to his food and is trying to eat low sodium foods although he just ate BBQ sandwiches last night. Has seen his cardiologist Gwen Pounds([place]) recently and has had his carvedilol increased to 25mg  twice daily. Returns to him on 05/15/16. 2: Hypotension- Blood pressure on the low side and with recent increase in carvedilol, advised him to only take his furosemide if he needs it based on weight or swelling in his legs. Has an appointment with a PCP (Bronstein) on 04/27/16 so he can follow-up with his blood pressure. 3: LV mural thrombus- He continues to take warfarin daily.  Patient did not bring his medications nor a list. Each medication was verbally reviewed with the patient and he was encouraged to bring the bottles to every visit to confirm accuracy of list.  Return here in 2 months or sooner for any questions/problems before then.

## 2016-04-19 NOTE — Patient Instructions (Addendum)
Continue weighing daily and call for an overnight weight gain of > 2 pounds or a weekly weight gain of >5 pounds.  Take furosemide just if you need it based on weight parameters above or if you develop swelling in your legs or begin to feel short of breath.

## 2016-04-20 DIAGNOSIS — I959 Hypotension, unspecified: Secondary | ICD-10-CM | POA: Insufficient documentation

## 2016-06-22 ENCOUNTER — Ambulatory Visit: Payer: BLUE CROSS/BLUE SHIELD | Attending: Family | Admitting: Family

## 2016-06-22 ENCOUNTER — Encounter: Payer: Self-pay | Admitting: Family

## 2016-06-22 VITALS — BP 106/54 | HR 79 | Resp 18 | Ht 65.0 in | Wt 146.0 lb

## 2016-06-22 DIAGNOSIS — Z7289 Other problems related to lifestyle: Secondary | ICD-10-CM | POA: Insufficient documentation

## 2016-06-22 DIAGNOSIS — I2510 Atherosclerotic heart disease of native coronary artery without angina pectoris: Secondary | ICD-10-CM | POA: Insufficient documentation

## 2016-06-22 DIAGNOSIS — I426 Alcoholic cardiomyopathy: Secondary | ICD-10-CM | POA: Diagnosis not present

## 2016-06-22 DIAGNOSIS — I5022 Chronic systolic (congestive) heart failure: Secondary | ICD-10-CM | POA: Diagnosis present

## 2016-06-22 DIAGNOSIS — Z87891 Personal history of nicotine dependence: Secondary | ICD-10-CM | POA: Insufficient documentation

## 2016-06-22 DIAGNOSIS — Z789 Other specified health status: Secondary | ICD-10-CM | POA: Insufficient documentation

## 2016-06-22 DIAGNOSIS — Z7901 Long term (current) use of anticoagulants: Secondary | ICD-10-CM | POA: Insufficient documentation

## 2016-06-22 DIAGNOSIS — Z9889 Other specified postprocedural states: Secondary | ICD-10-CM | POA: Diagnosis not present

## 2016-06-22 DIAGNOSIS — Z79899 Other long term (current) drug therapy: Secondary | ICD-10-CM | POA: Insufficient documentation

## 2016-06-22 DIAGNOSIS — I471 Supraventricular tachycardia: Secondary | ICD-10-CM | POA: Diagnosis not present

## 2016-06-22 DIAGNOSIS — I959 Hypotension, unspecified: Secondary | ICD-10-CM | POA: Insufficient documentation

## 2016-06-22 DIAGNOSIS — I950 Idiopathic hypotension: Secondary | ICD-10-CM

## 2016-06-22 NOTE — Patient Instructions (Addendum)
Continue weighing daily and call for an overnight weight gain of > 2 pounds or a weekly weight gain of >5 pounds.  Increase lisinopril to 10mg  daily. Monitor for signs of dizziness and check blood pressure weekly.

## 2016-06-22 NOTE — Progress Notes (Signed)
Patient ID: Theodore Brandt, male    DOB: 02-10-1966, 50 y.o.   MRN: [id number]  HPI  Theodore Brandt is a 50 y/o male with a history of paroxysmal SVT, CAD with MI, apical mural thrombus, remote tobacco use and chronic heart failure.  Last echo was done and showed an EF of 20% with mild valvular regurgitation but no valvular stenosis. This is essentially unchanged from previous echo done on 03/07/16.  Was in the ER on 03/13/16 due to an elevated INR of 8. Recheck in the ER showed and INR of 5.8 but without any active bleeding so did not require acute reversal. Was instructed to hold warfarin for one night and was discharged home. Previous admission was on 03/07/16 with HF exacerbation. Was treated with IV diuretics . 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 Was started on warfarin.   He presents today for a follow-up visit without any fatigue or shortness of breath. Denies any swelling in his legs/abdomen. Continues to weigh himself daily and says that he's gradually put on some weight but thinks it's because he's really not "doing much". Says that he eats one meal a day because he's scared of what might happen if he eats the wrong foods. Continues to drink 1-2 drinks of alcohol daily.   Past Medical History:  Diagnosis Date  . Apical mural thrombus 2017  . CHF (congestive heart failure) (HCC)   . Coronary artery disease    MI  . Enlarged heart   . Paroxysmal SVT (supraventricular tachycardia) (HCC)     Past Surgical History:  Procedure Laterality Date  . arm surgery    . TONSILLECTOMY      Family History  Problem Relation Age of Onset  . Diabetes Mother   . Hemochromatosis Father     Social History  Substance Use Topics  . Smoking status: Former Smoker    Types: Cigars  . Smokeless tobacco: Never Used  . Alcohol use Yes     Comment: Used to drink a 6-12 pack daily.      No Known Allergies  Prior to Admission medications    Medication Sig Start Date End Date Taking? Authorizing Provider  carvedilol (COREG) 25 MG tablet Take 25 mg by mouth 2 (two) times daily with a meal.   Yes Historical Provider, MD  furosemide (LASIX) 20 MG tablet Take 1 tablet (20 mg total) by mouth daily. Patient taking differently: Take 20 mg by mouth daily as needed.  03/12/16  Yes Houston SirenVivek J Sainani, MD  lisinopril (PRINIVIL,ZESTRIL) 5 MG tablet Take 1 tablet (5 mg total) by mouth daily. 03/12/16  Yes Houston SirenVivek J Sainani, MD  warfarin (COUMADIN) 5 MG tablet Take 1 tablet (5 mg total) by mouth daily at 6 PM. Patient taking differently: Take 2.5 mg by mouth daily at 6 PM.  03/12/16  Yes Houston SirenVivek J Sainani, MD     Review of Systems  Constitutional: Negative for appetite change and fatigue.  HENT: Positive for congestion. Negative for rhinorrhea and sore throat.   Eyes: Negative.   Respiratory: Negative for cough, chest tightness and shortness of breath.   Cardiovascular: Negative for chest pain, palpitations and leg swelling.  Gastrointestinal: Negative for abdominal distention and abdominal pain.  Endocrine: Negative.   Genitourinary: Negative.   Musculoskeletal: Negative for back pain and neck pain.  Skin: Negative.   Allergic/Immunologic: Negative.   Neurological: Negative for dizziness and light-headedness.  Hematological: Negative for adenopathy. Does not  bruise/bleed easily.  Psychiatric/Behavioral: Negative for dysphoric mood and sleep disturbance (sleeping on 3 pillows). The patient is nervous/anxious.     Vitals:   06/22/16 1326  BP: (!) 106/54  Pulse: 79  Resp: 18  SpO2: 99%  Weight: 146 lb (66.2 kg)  Height: 5\' 5"  (1.651 m)    Wt Readings from Last 3 Encounters:  06/22/16 146 lb (66.2 kg)  04/19/16 138 lb (62.6 kg)  03/23/16 148 lb (67.1 kg)    Lab Results  Component Value Date   CREATININE 1.16 03/12/2016   CREATININE 0.97 03/11/2016   CREATININE 0.88 03/10/2016     Physical Exam  Constitutional: He is oriented  to person, place, and time. He appears well-developed and well-nourished.  HENT:  Head: Normocephalic and atraumatic.  Eyes: Conjunctivae are normal. Pupils are equal, round, and reactive to light.  Neck: Normal range of motion. Neck supple.  Cardiovascular: Normal rate and regular rhythm.   Pulmonary/Chest: Effort normal. He has no wheezes. He has no rales.  Abdominal: Soft. He exhibits no distension. There is no tenderness.  Musculoskeletal: He exhibits no edema or tenderness.  Neurological: He is alert and oriented to person, place, and time.  Skin: Skin is warm and dry.  Psychiatric: His behavior is normal. Thought content normal. His mood appears anxious.  Nursing note and vitals reviewed.   Assessment & Plan:  1: Chronic heart failure with reduced ejection fraction- - NYHA class I - euvolemic - continue daily weights and call for an overnight weight gain of >2 pounds or a weekly weight gain of >5 pounds - not adding salt - saw cardiologist Gwen Pounds([place]) on 06/05/16 with recommendation of increasing lisinopril to 10mg  daily which patient has not done yet. Discussed the benefits of higher dose if able to tolerate and patient says that he'll try it. Returns to cardiologist on 10/04/16 - would like to switch his lisinopril to entresto if possible in the future  2: Hypotension- - BP looks good today - no dizziness - increasing lisinopril per above. Patient to check bp weekly to make sure it's not getting too low - taking diuretic only if needed  3: Alcohol use- - patient says that he drinks 1-2 drinks daily and doesn't feel like he can quit completely because the thought of that makes him anxious - is aware of the effects on his heart - complete cessation discussed for 3 minutes with him  Return here in 3 months or sooner for any questions/problems before then.

## 2016-09-24 ENCOUNTER — Telehealth: Payer: Self-pay | Admitting: Family

## 2016-09-24 ENCOUNTER — Ambulatory Visit: Payer: BLUE CROSS/BLUE SHIELD | Admitting: Family

## 2016-09-24 NOTE — Telephone Encounter (Signed)
Patient did not show for his [facility] appointment on 09/24/16. Will attempt to reschedule.  

## 2017-07-31 ENCOUNTER — Emergency Department: Payer: BLUE CROSS/BLUE SHIELD

## 2017-07-31 ENCOUNTER — Encounter: Payer: Self-pay | Admitting: Emergency Medicine

## 2017-07-31 ENCOUNTER — Emergency Department
Admission: EM | Admit: 2017-07-31 | Discharge: 2017-07-31 | Disposition: A | Discharge status: Home / Self Care | Payer: BLUE CROSS/BLUE SHIELD | Attending: Emergency Medicine | Admitting: Emergency Medicine

## 2017-07-31 DIAGNOSIS — I5022 Chronic systolic (congestive) heart failure: Secondary | ICD-10-CM | POA: Diagnosis not present

## 2017-07-31 DIAGNOSIS — I2510 Atherosclerotic heart disease of native coronary artery without angina pectoris: Secondary | ICD-10-CM | POA: Diagnosis not present

## 2017-07-31 DIAGNOSIS — N132 Hydronephrosis with renal and ureteral calculous obstruction: Secondary | ICD-10-CM | POA: Diagnosis not present

## 2017-07-31 DIAGNOSIS — Z87891 Personal history of nicotine dependence: Secondary | ICD-10-CM | POA: Insufficient documentation

## 2017-07-31 DIAGNOSIS — N23 Unspecified renal colic: Secondary | ICD-10-CM

## 2017-07-31 DIAGNOSIS — R1032 Left lower quadrant pain: Secondary | ICD-10-CM | POA: Diagnosis present

## 2017-07-31 DIAGNOSIS — I252 Old myocardial infarction: Secondary | ICD-10-CM | POA: Diagnosis not present

## 2017-07-31 LAB — BASIC METABOLIC PANEL
Anion gap: 8 (ref 5–15)
BUN: 22 mg/dL — ABNORMAL HIGH (ref 6–20)
CALCIUM: 9.1 mg/dL (ref 8.9–10.3)
CO2: 23 mmol/L (ref 22–32)
CREATININE: 1.22 mg/dL (ref 0.61–1.24)
Chloride: 105 mmol/L (ref 101–111)
GFR calc Af Amer: >60 mL/min (ref >60)
GLUCOSE: 110 mg/dL — AB (ref 65–99)
Potassium: 4.1 mmol/L (ref 3.5–5.1)
Sodium: 136 mmol/L (ref 135–145)

## 2017-07-31 LAB — URINALYSIS, COMPLETE (UACMP) WITH MICROSCOPIC
Bacteria, UA: NONE SEEN
Bilirubin Urine: NEGATIVE
GLUCOSE, UA: NEGATIVE mg/dL
KETONES UR: NEGATIVE mg/dL
NITRITE: NEGATIVE
PROTEIN: NEGATIVE mg/dL
Specific Gravity, Urine: 1.016 (ref 1.005–1.030)
Squamous Epithelial / LPF: NONE SEEN
pH: 5 (ref 5.0–8.0)

## 2017-07-31 LAB — CBC
HCT: 47.2 % (ref 40.0–52.0)
Hemoglobin: 15.9 g/dL (ref 13.0–18.0)
MCH: 28.3 pg (ref 26.0–34.0)
MCHC: 33.6 g/dL (ref 32.0–36.0)
MCV: 84.3 fL (ref 80.0–100.0)
Platelets: 206 10*3/uL (ref 150–440)
RBC: 5.6 MIL/uL (ref 4.40–5.90)
RDW: 13.8 % (ref 11.5–14.5)
WBC: 14.2 10*3/uL — ABNORMAL HIGH (ref 3.8–10.6)

## 2017-07-31 MED ORDER — ONDANSETRON HCL 4 MG/2ML IJ SOLN
4.0000 mg | Freq: Once | INTRAMUSCULAR | Status: AC
  Administered 2017-07-31: 4 mg via INTRAVENOUS

## 2017-07-31 MED ORDER — OXYCODONE-ACETAMINOPHEN 5-325 MG PO TABS: 1.0000 | ORAL_TABLET | ORAL | 0 refills | Status: AC | PRN

## 2017-07-31 MED ORDER — FENTANYL CITRATE (PF) 100 MCG/2ML IJ SOLN
50.0000 ug | INTRAMUSCULAR | Status: DC | PRN
  Administered 2017-07-31: 50 ug via INTRAVENOUS
  Filled 2017-07-31: qty 2

## 2017-07-31 MED ORDER — SODIUM CHLORIDE 0.9 % IV BOLUS (SEPSIS)
1000.0000 mL | Freq: Once | INTRAVENOUS | Status: AC
  Administered 2017-07-31: 1000 mL via INTRAVENOUS

## 2017-07-31 MED ORDER — ONDANSETRON HCL 4 MG/2ML IJ SOLN
INTRAMUSCULAR | Status: AC
  Filled 2017-07-31: qty 2

## 2017-07-31 MED ORDER — KETOROLAC TROMETHAMINE 30 MG/ML IJ SOLN
INTRAMUSCULAR | Status: AC
  Filled 2017-07-31: qty 1

## 2017-07-31 MED ORDER — TAMSULOSIN HCL 0.4 MG PO CAPS: 0.4000 mg | ORAL_CAPSULE | Freq: Every day | ORAL | 0 refills | Status: AC

## 2017-07-31 MED ORDER — ONDANSETRON 4 MG PO TBDP: 4.0000 mg | ORAL_TABLET | Freq: Three times a day (TID) | ORAL | 0 refills | Status: AC | PRN

## 2017-07-31 MED ORDER — ONDANSETRON HCL 4 MG/2ML IJ SOLN
4.0000 mg | Freq: Once | INTRAMUSCULAR | Status: AC
  Administered 2017-07-31: 4 mg via INTRAVENOUS
  Filled 2017-07-31: qty 2

## 2017-07-31 MED ORDER — KETOROLAC TROMETHAMINE 10 MG PO TABS
10.0000 mg | ORAL_TABLET | Freq: Three times a day (TID) | ORAL | 0 refills | Status: AC | PRN
Start: 2017-07-31 — End: ?

## 2017-07-31 MED ORDER — KETOROLAC TROMETHAMINE 30 MG/ML IJ SOLN
30.0000 mg | Freq: Once | INTRAMUSCULAR | Status: AC
  Administered 2017-07-31: 30 mg via INTRAVENOUS

## 2017-07-31 NOTE — ED Provider Notes (Signed)
[facility] Emergency Department Provider Note  ____________________________________________  Time seen: Approximately 8:04 PM  I have reviewed the triage vital signs and the nursing notes.   HISTORY  Chief Complaint Flank Pain    HPI Theodore Brandt is a 51 y.o. male with a history of CAD, CHF, paroxysmal SVT, presenting with left flank pain radiating to the left lower quadrant associated with nausea and vomiting.  The patient reports that he began to have a dull ache while he was at work earlier this afternoon, and then "hit all of a sudden."  He developed a sharp pain, which is now radiating to the left lower quadrant.  He denies any fever, chills, dysuria or hematuria, urinary frequency.  He received some pain medication and triage which improved his symptoms.    Family history is positive for renal colic in his father.    Past Medical History:  Diagnosis Date  . Apical mural thrombus 2017  . CHF (congestive heart failure) (HCC)   . Coronary artery disease    MI  . Enlarged heart   . Paroxysmal SVT (supraventricular tachycardia) [facility])     Patient Active Problem List   Diagnosis Date Noted  . Alcohol use 06/22/2016  . Hypotension 04/20/2016  . Chronic systolic heart failure (HCC) 03/23/2016  . Tachycardia 03/23/2016  . 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 LV (left ventricular) mural thrombus 03/09/2016  . NSTEMI (non-ST elevated myocardial infarction) (HCC) 03/07/2016    Past Surgical History:  Procedure Laterality Date  . arm surgery    . TONSILLECTOMY      Current Outpatient Rx  . Order #: [id number] Class: Historical Med  . Order #: [id number] Class: Print  . Order #: [id number] Class: Print  . Order #: [id number] Class: Print    Allergies Patient has no known allergies.  Family History  Problem Relation Age of Onset  . Diabetes Mother   . Hemochromatosis Father     Social History Social History   Tobacco Use   . Smoking status: Former Smoker    Types: Cigars  . Smokeless tobacco: Never Used  Substance Use Topics  . Alcohol use: Yes    Comment: Used to drink a 6-12 pack daily.    . Drug use: No    Review of Systems Constitutional: No fever/chills.  No lightheadedness or syncope. Eyes: No visual changes. ENT: No sore throat. No congestion or rhinorrhea. Cardiovascular: Denies chest pain. Denies palpitations. Respiratory: Denies shortness of breath.  No cough. Gastrointestinal: Positive nausea, positive vomiting.  No diarrhea.  No constipation.  Positive left flank pain radiating to the left lower quadrant. Genitourinary: Negative for dysuria.  No penile pain or discharge.  No scrotal pain.  No testicular pain. Musculoskeletal: Negative for back pain. Skin: Negative for rash. Neurological: Negative for headaches. No focal numbness, tingling or weakness.     ____________________________________________   PHYSICAL EXAM:  VITAL SIGNS: ED Triage Vitals  Enc Vitals Group     BP 07/31/17 1839 (!) 143/85     Pulse Rate 07/31/17 1839 74     Resp 07/31/17 1839 16     Temp 07/31/17 1839 99 F (37.2 C)     Temp Source 07/31/17 1839 Oral     SpO2 07/31/17 1839 99 %     Weight 07/31/17 1839 150 lb (68 kg)     Height 07/31/17 1839 5\' 6"  (1.676 m)     Head Circumference --      Peak Flow --  Pain Score 07/31/17 1838 10     Pain Loc --      Pain Edu? --      Excl. in GC? --     Constitutional: Alert and oriented. Well appearing and in no acute distress. Answers questions appropriately. Eyes: Conjunctivae are normal.  EOMI. No scleral icterus. Head: Atraumatic. Nose: No congestion/rhinnorhea. Mouth/Throat: Mucous membranes are moist.  Neck: No stridor.  Supple.   Cardiovascular: Normal rate, regular rhythm. No murmurs, rubs or gallops.  Respiratory: Normal respiratory effort.  No accessory muscle use or retractions. Lungs CTAB.  No wheezes, rales or ronchi. Gastrointestinal:  Positive left CVA tenderness to palpation.  Soft, and nondistended.  Minimal tenderness to palpation in the left lower quadrant.  No guarding or rebound.  No peritoneal signs. Genitourinary: Deferred as the patient is asymptomatic. Musculoskeletal: No LE edema.. Neurologic:  A&Ox3.  Speech is clear.  Face and smile are symmetric.  EOMI.  Moves all extremities well. Skin:  Skin is warm, dry and intact. No rash noted. Psychiatric: Mood and affect are normal. Speech and behavior are normal.  Normal judgement.  ____________________________________________   LABS (all labs ordered are listed, but only abnormal results are displayed)  Labs Reviewed  BASIC METABOLIC PANEL - Abnormal; Notable for the following components:      Result Value   Glucose, Bld 110 (*)    BUN 22 (*)    All other components within normal limits  CBC - Abnormal; Notable for the following components:   WBC 14.2 (*)    All other components within normal limits  URINALYSIS, COMPLETE (UACMP) WITH MICROSCOPIC   ____________________________________________  EKG  Not indicated ____________________________________________  RADIOLOGY  No results found.  ____________________________________________   PROCEDURES  Procedure(s) performed: None  Procedures  Critical Care performed: No ____________________________________________   INITIAL IMPRESSION / ASSESSMENT AND PLAN / ED COURSE  Pertinent labs & imaging results that were available during my care of the patient were reviewed by me and considered in my medical decision making (see chart for details).  51 y.o. male presenting with left flank pain radiating to the left lower quadrant associated with intractable nausea and vomiting.  Overall, the patient is hemodynamically stable and afebrile here.  I am most concerned about renal colic.  Given his multiple other cardiac risk factors, aortic pathology is possible but much less likely.  A intestinal cause  including diverticulitis is also possible but less likely.  The patient will undergo CT renal stone protocol, and receive symptomatic treatment at this time.  Plan reevaluation for final disposition.  I reviewed the patient's medical chart.  ----------------------------------------- 9:44 PM on 07/31/2017 -----------------------------------------  The patient CT scan does show a 5 mm proximal ureteral stone with some hydronephrosis.  He has a elevated white blood cell count, but his creatinine is within normal limits, and he has no evidence of infection on his urine.  The patient's symptoms are well controlled and he is able to tolerate liquids without vomiting.  At this time, the patient is safe for discharge home and outpatient urology follow-up.  Will provide him with a urinary strainer.  I had an at length discussion with the patient and his parents about return precautions as well as follow-up instructions.  ____________________________________________  FINAL CLINICAL IMPRESSION(S) / ED DIAGNOSES  Final diagnoses:  None         NEW MEDICATIONS STARTED DURING THIS VISIT:  This SmartLink is deprecated. Use AVSMEDLIST instead to display the medication list for a  patient.    Rockne MenghiniNorman, Anne-Caroline, MD 07/31/17 2145

## 2017-07-31 NOTE — ED Triage Notes (Signed)
Pt in via POV with complaints of sudden onset left side flank pain x one day, reports associated N/V.  Pt denies any urinary symptoms.  Vitals WDL.

## 2017-07-31 NOTE — Discharge Instructions (Signed)
Please drink plenty of fluids stay well-hydrated and to help your kidney stone pass.  Please strain your urine and if you catch the kidney stone, bring it with you to follow-up with the urologist.  You may take Tylenol or Toradol for mild to moderate pain.  If you take Toradol, please take it with food, and do not take any other NSAID medications including Motrin, Aleve, ibuprofen or Advil.  For severe pain, take Percocet.  Do not drive within 8 hours of taking Percocet.  Zofran is for nausea.  Return to the emergency department if you develop severe pain, inability to keep down fluids, fever, bloody urine, or any other symptoms concerning to you.

## 2017-07-31 NOTE — ED Notes (Signed)
Patient transported to CT 

## 2017-07-31 NOTE — ED Notes (Signed)
Pt states, "I just used the bathroom."  Pt is unable to provide urine specimen at this time.

## 2018-05-23 IMAGING — CT CT RENAL STONE PROTOCOL
2 of 4 series · 16 of 46 positions shown, 18 images · non-contrast
Comparison: None.

CLINICAL DATA: Left flank pain

EXAM:
CT ABDOMEN AND PELVIS WITHOUT CONTRAST
TECHNIQUE: Multidetector CT imaging of the abdomen and pelvis was performed
following the standard protocol without IV contrast.

[Series 2: stone full standard · axial · 0.66mm/px · z∈[-376,+4]mm · 13 of 84 slices shown, 15 images]
[im 4/84  soft-tissue]
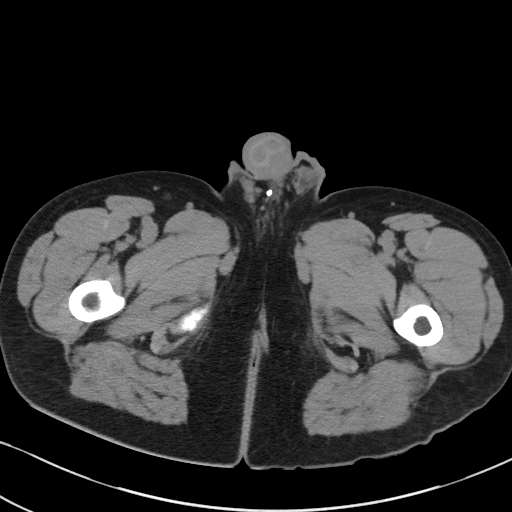
[im 4/84  bone]
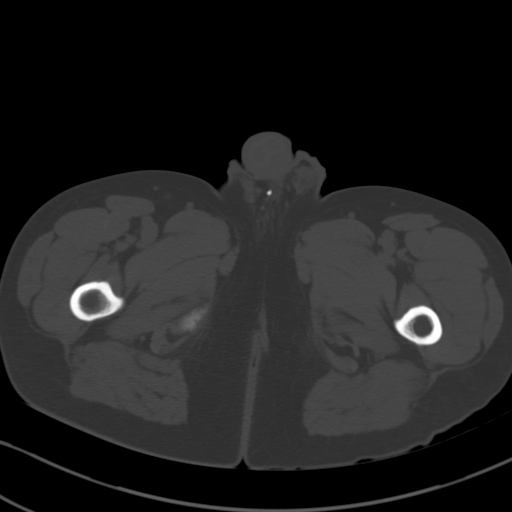
[im 10/84  soft-tissue]
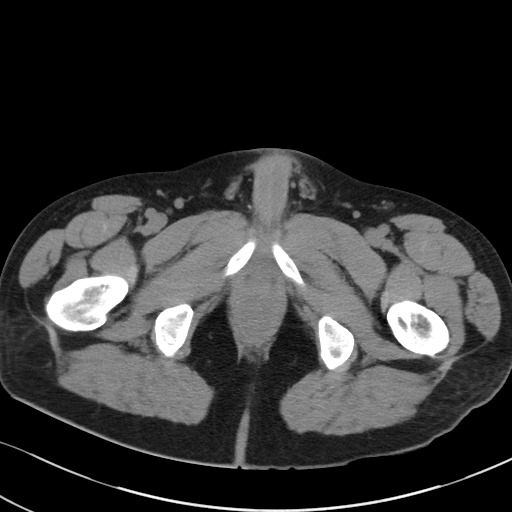
[im 16/84  soft-tissue]
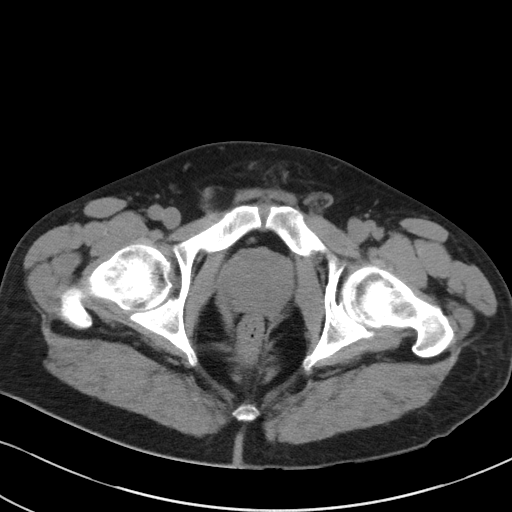
[im 23/84  soft-tissue]
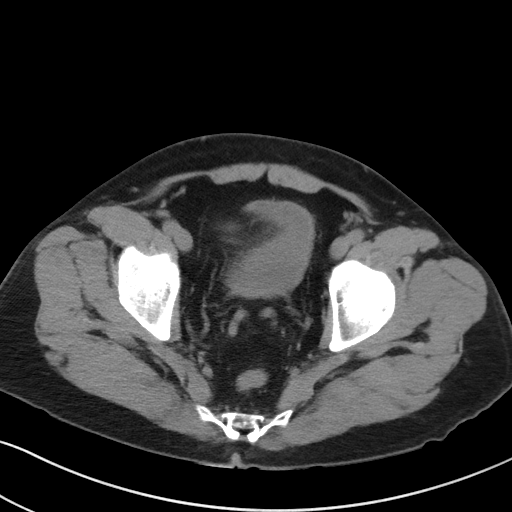
[im 29/84  soft-tissue]
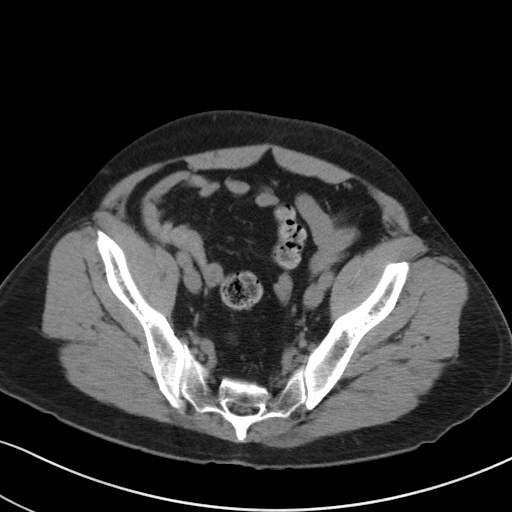
[im 36/84  soft-tissue]
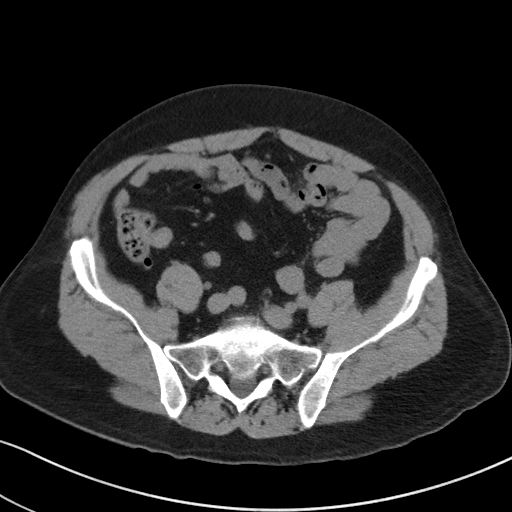
[im 42/84  soft-tissue]
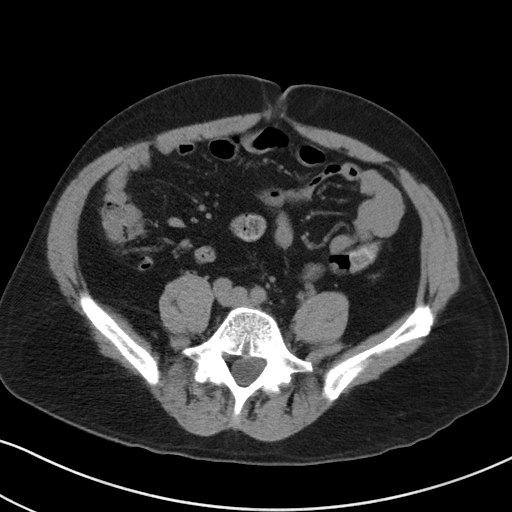
[im 48/84  soft-tissue]
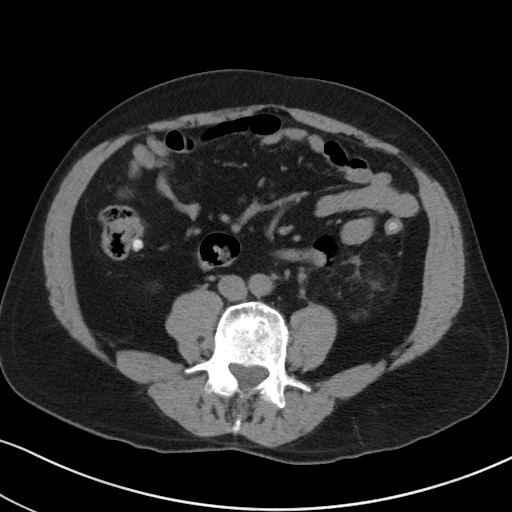
[im 55/84  soft-tissue]
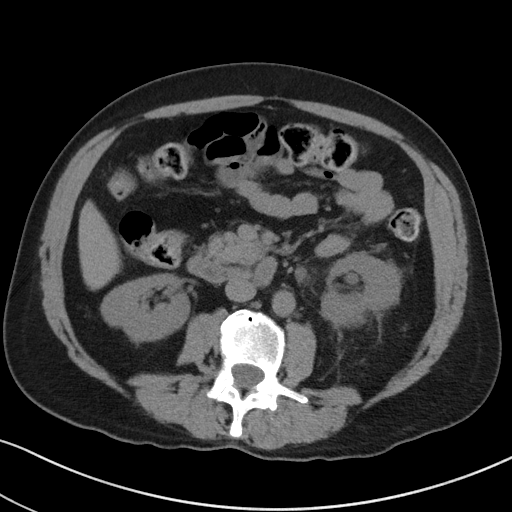
[im 55/84  bone]
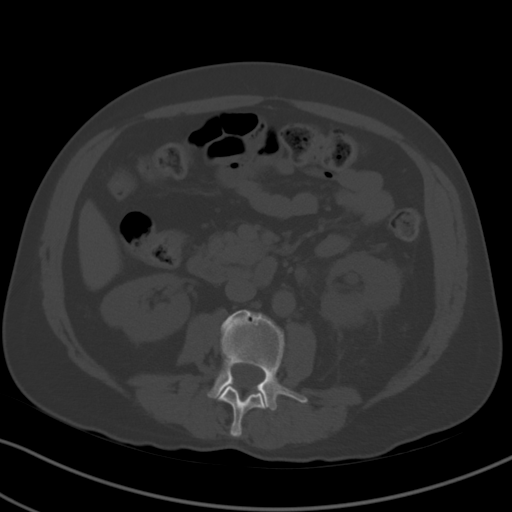
[im 61/84  soft-tissue]
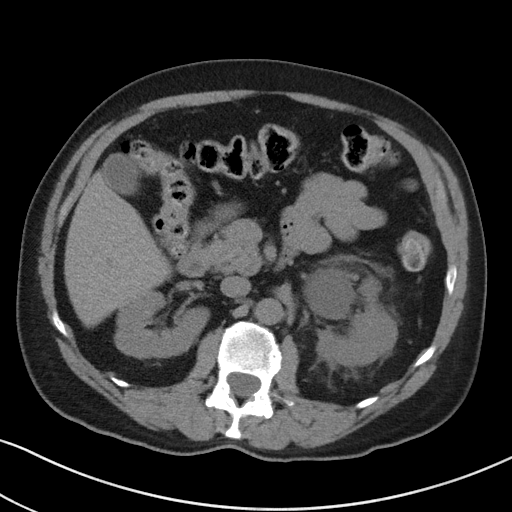
[im 68/84  soft-tissue]
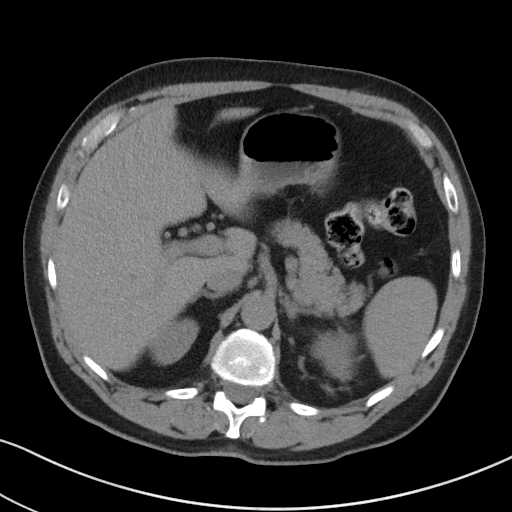
[im 74/84  soft-tissue]
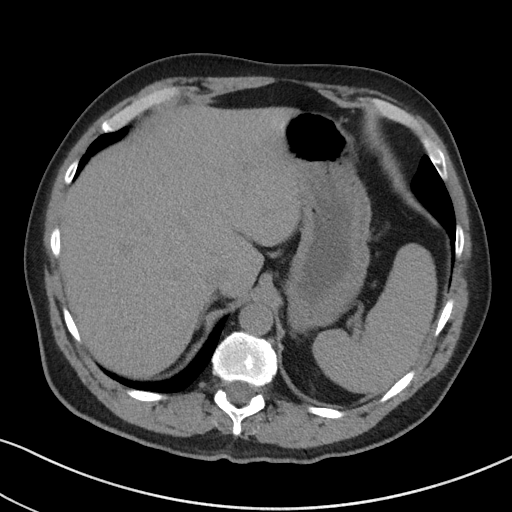
[im 80/84  soft-tissue]
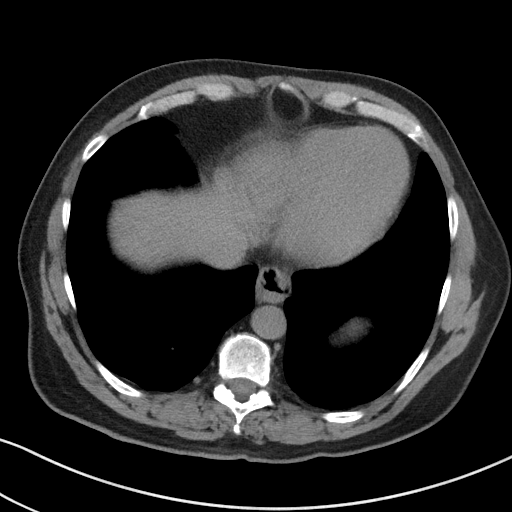

[Series 5: coronal · coronal · 0.64mm/px · 3 of 120 slices shown]
[im 40/120  soft-tissue]
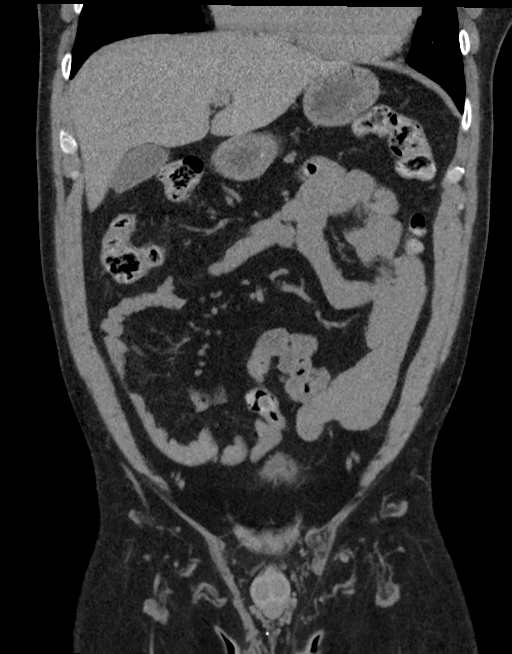
[im 53/120  soft-tissue]
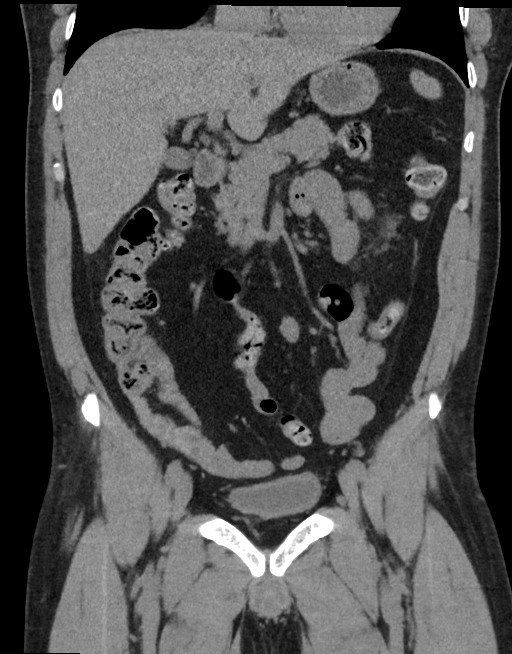
[im 67/120  soft-tissue]
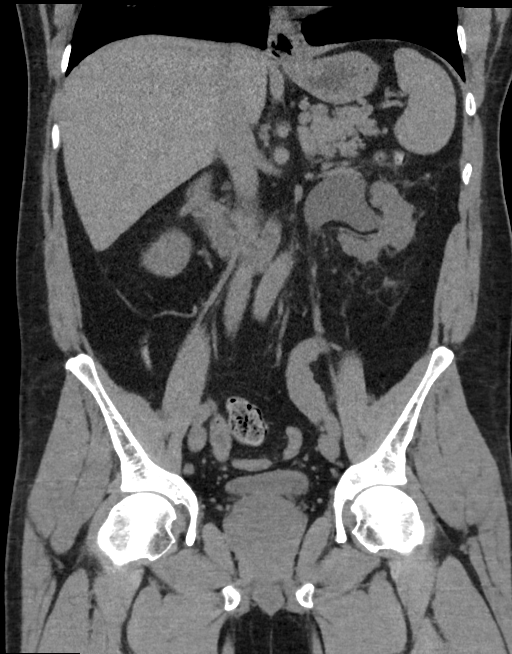

[16 of 46 positions shown; findings below may reference images not displayed]

FINDINGS: Lower chest: Lung bases are clear. No effusions. Heart is normal
size.

Hepatobiliary: No focal hepatic abnormality. Gallbladder
unremarkable.

Pancreas: No focal abnormality or ductal dilatation.

Spleen: No focal abnormality.  Normal size.

Adrenals/Urinary Tract: 5 mm proximal left ureteral stone with
severe left hydronephrosis and perinephric stranding. Right kidney,
adrenal glands and urinary bladder unremarkable.

Stomach/Bowel: Appendix is normal. Stomach, large and small bowel
grossly unremarkable.

Vascular/Lymphatic: No evidence of aneurysm or adenopathy.

Reproductive: Prostate enlargement.

Other: No free fluid or free air.

Musculoskeletal: No acute bony abnormality. Chronic appearing
moderate to severe compression fracture at L4.
IMPRESSION: 5 mm proximal left ureteral stone with severe left hydronephrosis
and perinephric stranding.

Chronic appearing moderate to severe compression fracture at L4.

## 2021-09-12 ENCOUNTER — Ambulatory Visit
Admission: RE | Admit: 2021-09-12 | Discharge: 2021-09-12 | Disposition: A | Discharge status: Home / Self Care | Payer: 59 | Source: Ambulatory Visit | Attending: Internal Medicine | Admitting: Internal Medicine

## 2021-09-12 ENCOUNTER — Other Ambulatory Visit: Payer: Self-pay | Admitting: Internal Medicine

## 2021-09-12 ENCOUNTER — Other Ambulatory Visit: Payer: Self-pay

## 2021-09-12 DIAGNOSIS — I420 Dilated cardiomyopathy: Secondary | ICD-10-CM | POA: Diagnosis not present

## 2021-09-12 DIAGNOSIS — R0602 Shortness of breath: Secondary | ICD-10-CM

## 2021-09-12 DIAGNOSIS — R600 Localized edema: Secondary | ICD-10-CM | POA: Diagnosis present

## 2021-10-09 ENCOUNTER — Ambulatory Visit: Payer: Self-pay | Admitting: *Deleted

## 2021-10-09 NOTE — Telephone Encounter (Signed)
°  Chief Complaint: patient 's mother with patient and reports patient with left rib pain under "arm pit' when coughing .  Symptoms: severe pain with coughing left rib area Frequency: couple of days  Pertinent Negatives: Patient denies chest pain , difficulty breathing, no fever, no pain breathing in Disposition: [] ED /[x] Urgent Care (no appt availability in office) / [] Appointment(In office/virtual)/ []  [place]/ [] Home Care/ [] Refused Recommended Disposition /[]  Mobile Bus/ []  Follow-up with PCP Additional Notes:   Patient has not seen PCP yet as new patient. Recommended UC / ED due to c/o severe pain when coughing.    Reason for Disposition  [1] Chest pain(s) lasting a few seconds from coughing AND [2] persists > 3 days  Answer Assessment - Initial Assessment Questions 1. LOCATION: "Where does it hurt?"       Left upper rib area under "arm pit" 2. RADIATION: "Does the pain go anywhere else?" (e.g., into neck, jaw, arms, back)     No  3. ONSET: "When did the chest pain begin?" (Minutes, hours or days)      Left rib pain sever when coughing a couple of days  4. PATTERN "Does the pain come and go, or has it been constant since it started?"  "Does it get worse with exertion?"      When coughing  5. DURATION: "How long does it last" (e.g., seconds, minutes, hours)     While coughing  6. SEVERITY: "How bad is the pain?"  (e.g., Scale 1-10; mild, moderate, or severe)    - MILD (1-3): doesn't interfere with normal activities     - MODERATE (4-7): interferes with normal activities or awakens from sleep    - SEVERE (8-10): excruciating pain, unable to do any normal activities       Severe reported  7. CARDIAC RISK FACTORS: "Do you have any history of heart problems or risk factors for heart disease?" (e.g., angina, prior heart attack; diabetes, high blood pressure, high cholesterol, smoker, or strong family history of heart disease)     Hx enlarged heart 8. PULMONARY  RISK FACTORS: "Do you have any history of lung disease?"  (e.g., blood clots in lung, asthma, emphysema, birth control pills)     na 9. CAUSE: "What do you think is causing the chest pain?"     Not sure  10. OTHER SYMPTOMS: "Do you have any other symptoms?" (e.g., dizziness, nausea, vomiting, sweating, fever, difficulty breathing, cough)       Cough non productive or clear if productive , left rib pain coughing  11. PREGNANCY: "Is there any chance you are pregnant?" "When was your last menstrual period?"       na  Protocols used: Chest Pain-A-AH

## 2022-07-05 IMAGING — US US EXTREM LOW VENOUS
1 series · 12 of 24 positions shown · non-contrast
Comparison: None.

CLINICAL DATA: Bilateral lower extremity edema. Shortness of breath
for the past 4 months. Evaluate for DVT.



[Series 1: us extrem low venous · 12 of 71 slices shown]
[im 4/71]
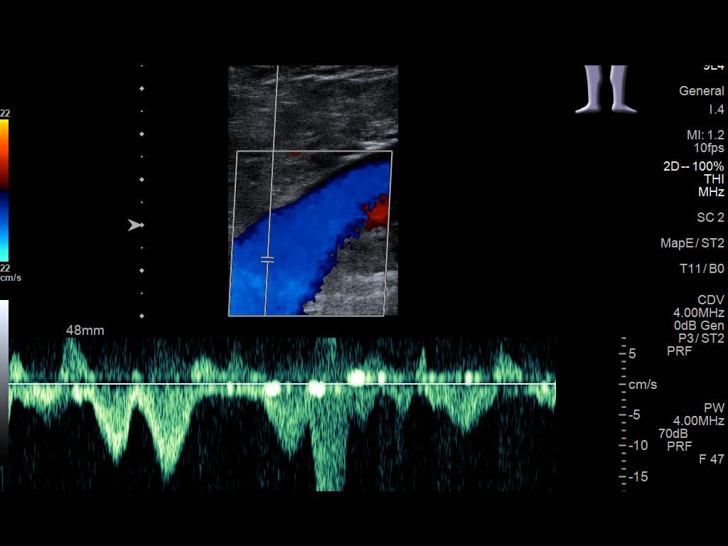
[im 10/71]
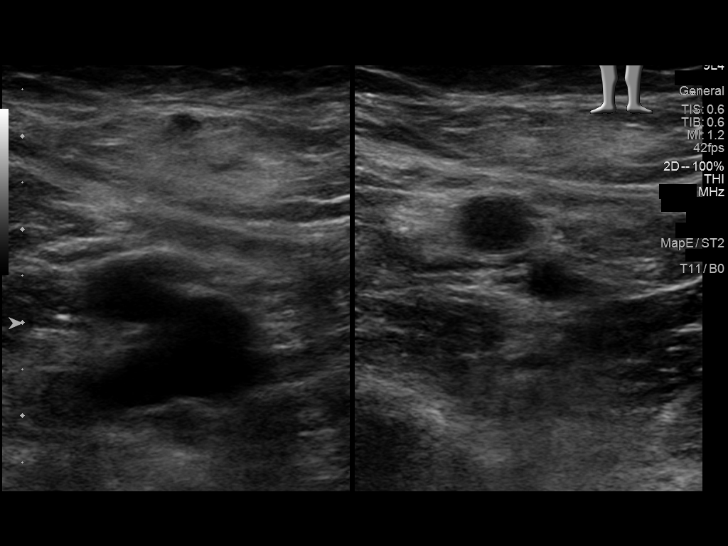
[im 16/71]
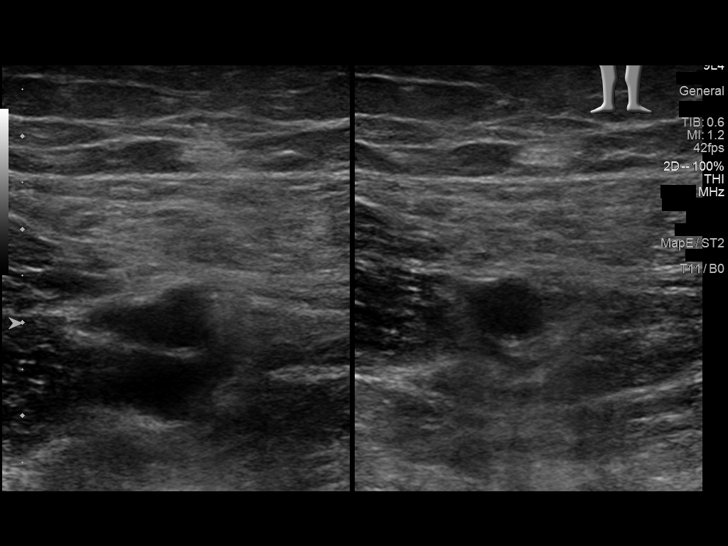
[im 22/71]
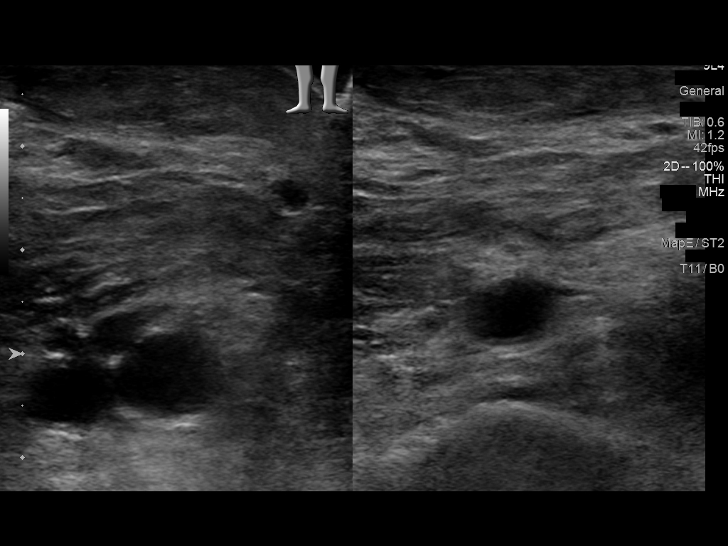
[im 28/71]
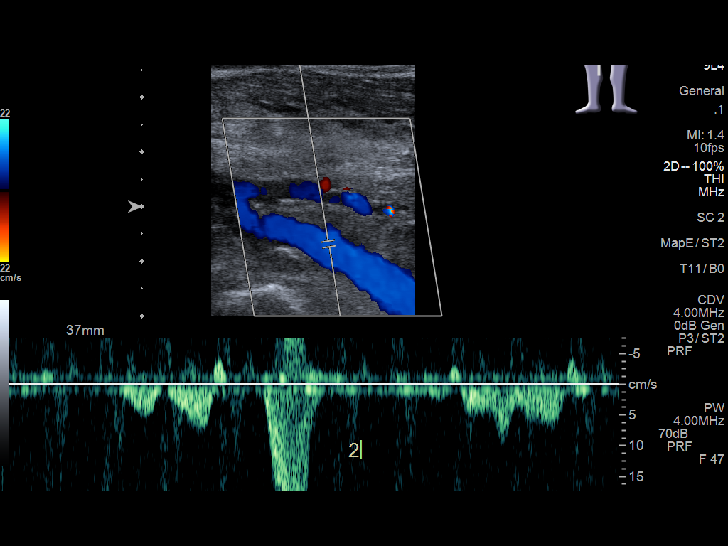
[im 34/71]
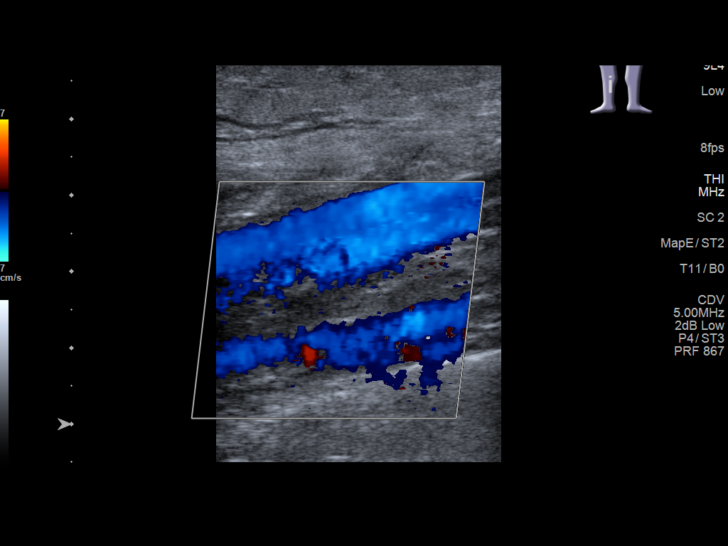
[im 40/71]
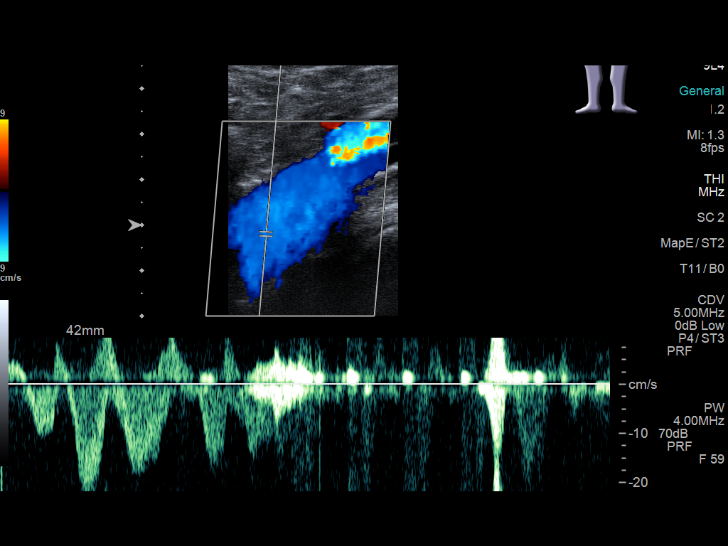
[im 46/71]
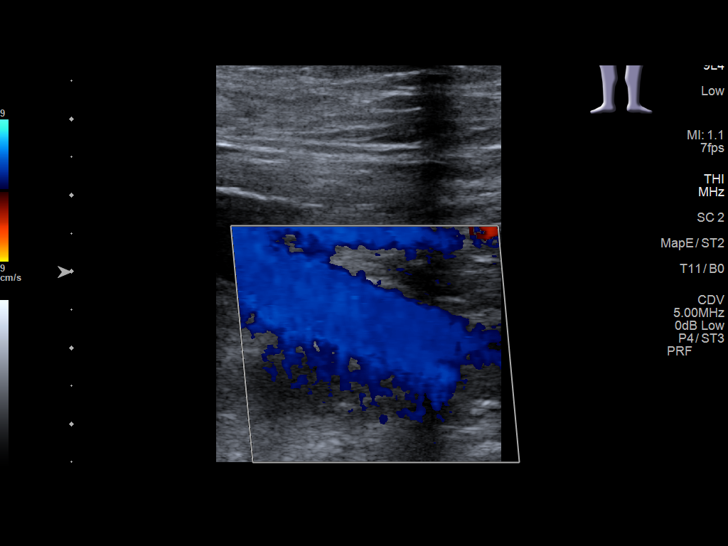
[im 52/71]
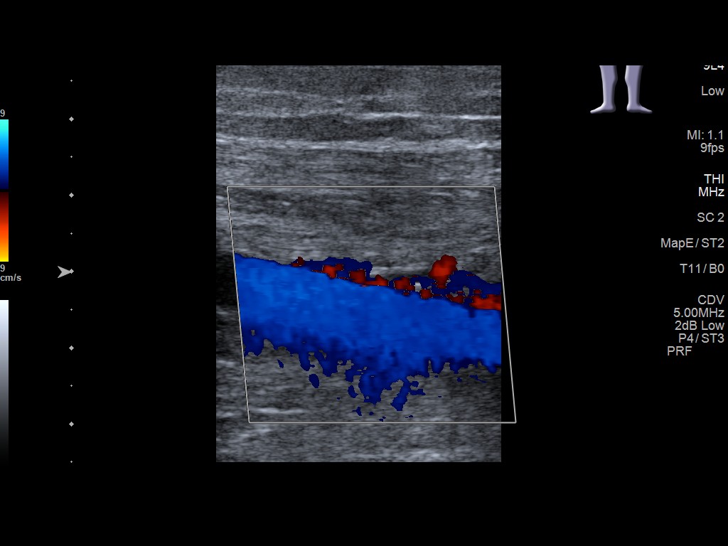
[im 58/71]
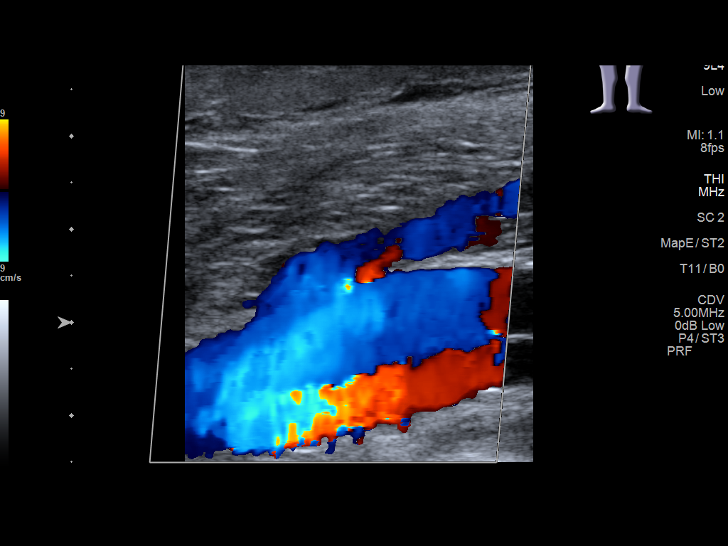
[im 64/71]
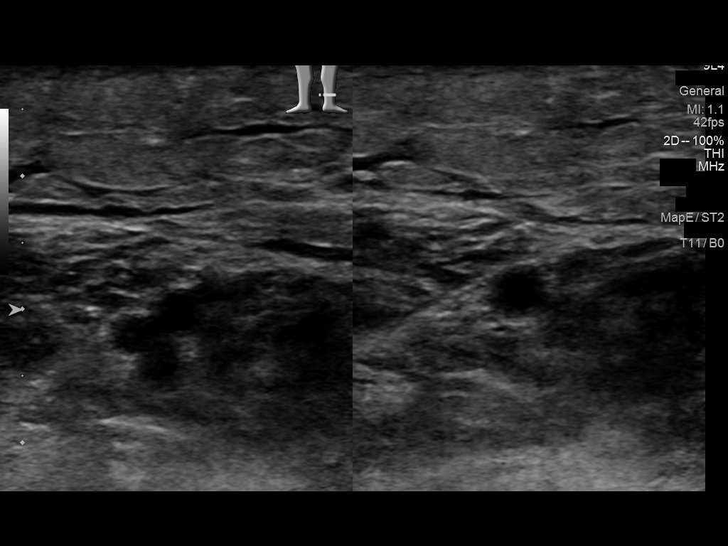
[im 71/71]
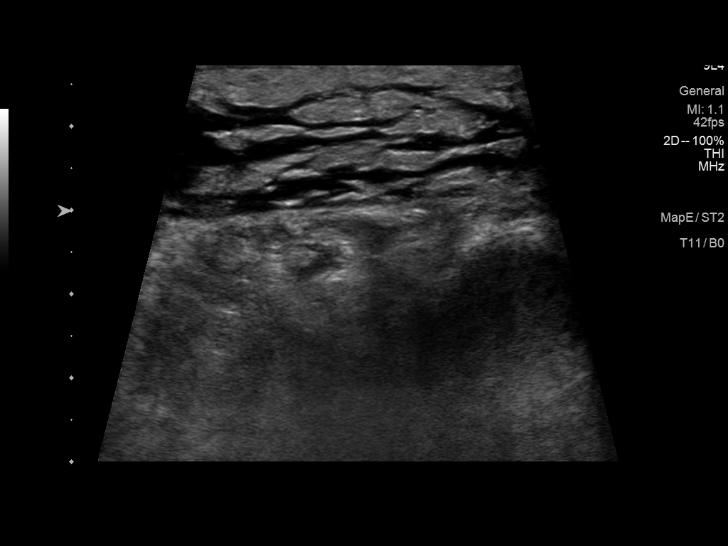

[12 of 24 positions shown; findings below may reference images not displayed]

FINDINGS: RIGHT LOWER EXTREMITY

Common Femoral Vein: No evidence of thrombus. Normal
compressibility, respiratory phasicity and response to augmentation.

Saphenofemoral Junction: No evidence of thrombus. Normal
compressibility and flow on color Doppler imaging.

Profunda Femoral Vein: No evidence of thrombus. Normal
compressibility and flow on color Doppler imaging.

Femoral Vein: No evidence of thrombus. Normal compressibility,
respiratory phasicity and response to augmentation.

Popliteal Vein: No evidence of thrombus within either of the paired
segments of the right popliteal vein. Normal compressibility,
respiratory phasicity and response to augmentation.

Calf Veins: No evidence of thrombus. Normal compressibility and flow
on color Doppler imaging.

Superficial Great Saphenous Vein: No evidence of thrombus. Normal
compressibility.

Venous Reflux:  None.

Other Findings: There is a minimal amount of subcutaneous edema at
the level of the right calf. Pulsatile flow is seen throughout the
interrogated portions of the right lower extremity venous system.

LEFT LOWER EXTREMITY

Common Femoral Vein: No evidence of thrombus. Normal
compressibility, respiratory phasicity and response to augmentation.

Saphenofemoral Junction: No evidence of thrombus. Normal
compressibility and flow on color Doppler imaging.

Profunda Femoral Vein: No evidence of thrombus. Normal
compressibility and flow on color Doppler imaging.

Femoral Vein: No evidence of thrombus. Normal compressibility,
respiratory phasicity and response to augmentation.

Popliteal Vein: No evidence of thrombus within either of the paired
segments of the left popliteal vein. Normal compressibility,
respiratory phasicity and response to augmentation.

Calf Veins: No evidence of thrombus. Normal compressibility and flow
on color Doppler imaging.

Superficial Great Saphenous Vein: No evidence of thrombus. Normal
compressibility.

Venous Reflux:  None.

Other Findings: There is a moderate amount of subcutaneous edema at
the level of the left calf (image 72). Pulsatile flow is seen
throughout the interrogated portions of the left lower extremity
venous system.
IMPRESSION: 1. No evidence of DVT within either lower extremity.
2. Pulsatile venous flow is demonstrated throughout the interrogated
portions of the bilateral lower extremity venous systems,
nonspecific though could be seen in the setting of right-sided heart
failure. Clinical correlation is advised.

## 2024-07-29 ENCOUNTER — Other Ambulatory Visit: Payer: Self-pay | Admitting: Internal Medicine

## 2024-07-29 DIAGNOSIS — I5020 Unspecified systolic (congestive) heart failure: Secondary | ICD-10-CM

## 2024-08-31 ENCOUNTER — Encounter (HOSPITAL_COMMUNITY): Payer: Self-pay

## 2024-08-31 ENCOUNTER — Other Ambulatory Visit (HOSPITAL_COMMUNITY): Payer: Self-pay | Admitting: Emergency Medicine

## 2024-09-02 ENCOUNTER — Ambulatory Visit
Admission: RE | Admit: 2024-09-02 | Discharge: 2024-09-02 | Disposition: A | Discharge status: Home / Self Care | Source: Ambulatory Visit | Attending: Internal Medicine | Admitting: Internal Medicine

## 2024-09-02 ENCOUNTER — Other Ambulatory Visit: Payer: Self-pay | Admitting: Internal Medicine

## 2024-09-02 DIAGNOSIS — I5020 Unspecified systolic (congestive) heart failure: Secondary | ICD-10-CM | POA: Diagnosis not present

## 2024-09-02 MED ORDER — GADOBUTROL 1 MMOL/ML IV SOLN
10.0000 mL | Freq: Once | INTRAVENOUS | Status: AC | PRN
  Administered 2024-09-02: 10 mL via INTRAVENOUS
